# Patient Record
Sex: Female | Born: 1940 | Race: White | Hispanic: No | Smoking: Former smoker
Health system: Southern US, Community
[De-identification: ages and names within clinical notes are randomized; demographics above are authoritative.]

## PROBLEM LIST (undated history)

## (undated) DIAGNOSIS — E785 Hyperlipidemia, unspecified: Secondary | ICD-10-CM

## (undated) DIAGNOSIS — M810 Age-related osteoporosis without current pathological fracture: Secondary | ICD-10-CM

## (undated) DIAGNOSIS — T7840XA Allergy, unspecified, initial encounter: Secondary | ICD-10-CM

## (undated) DIAGNOSIS — I719 Aortic aneurysm of unspecified site, without rupture: Secondary | ICD-10-CM

## (undated) DIAGNOSIS — M419 Scoliosis, unspecified: Secondary | ICD-10-CM

## (undated) DIAGNOSIS — H269 Unspecified cataract: Secondary | ICD-10-CM

## (undated) DIAGNOSIS — I10 Essential (primary) hypertension: Secondary | ICD-10-CM

## (undated) DIAGNOSIS — K219 Gastro-esophageal reflux disease without esophagitis: Secondary | ICD-10-CM

## (undated) DIAGNOSIS — D034 Melanoma in situ of scalp and neck: Secondary | ICD-10-CM

## (undated) DIAGNOSIS — F0390 Unspecified dementia without behavioral disturbance: Secondary | ICD-10-CM

## (undated) DIAGNOSIS — M1990 Unspecified osteoarthritis, unspecified site: Secondary | ICD-10-CM

## (undated) DIAGNOSIS — K259 Gastric ulcer, unspecified as acute or chronic, without hemorrhage or perforation: Secondary | ICD-10-CM

## (undated) DIAGNOSIS — M4050 Lordosis, unspecified, site unspecified: Secondary | ICD-10-CM

## (undated) HISTORY — DX: Unspecified cataract: H26.9

## (undated) HISTORY — DX: Scoliosis, unspecified: M41.9

## (undated) HISTORY — DX: Age-related osteoporosis without current pathological fracture: M81.0

## (undated) HISTORY — DX: Hyperlipidemia, unspecified: E78.5

## (undated) HISTORY — DX: Unspecified osteoarthritis, unspecified site: M19.90

## (undated) HISTORY — DX: Aortic aneurysm of unspecified site, without rupture: I71.9

## (undated) HISTORY — DX: Gastro-esophageal reflux disease without esophagitis: K21.9

## (undated) HISTORY — DX: Melanoma in situ of scalp and neck: D03.4

## (undated) HISTORY — DX: Gastric ulcer, unspecified as acute or chronic, without hemorrhage or perforation: K25.9

## (undated) HISTORY — PX: OTHER SURGICAL HISTORY: SHX169

## (undated) HISTORY — DX: Essential (primary) hypertension: I10

## (undated) HISTORY — DX: Lordosis, unspecified, site unspecified: M40.50

## (undated) HISTORY — DX: Allergy, unspecified, initial encounter: T78.40XA

---

## 1988-11-04 DIAGNOSIS — D034 Melanoma in situ of scalp and neck: Secondary | ICD-10-CM

## 1988-11-04 HISTORY — DX: Melanoma in situ of scalp and neck: D03.4

## 2002-04-28 ENCOUNTER — Encounter: Admission: RE | Admit: 2002-04-28 | Discharge: 2002-07-27 | Payer: Self-pay | Admitting: Pain Medicine

## 2005-03-09 ENCOUNTER — Ambulatory Visit: Payer: Self-pay | Admitting: Cardiology

## 2005-03-17 ENCOUNTER — Ambulatory Visit: Payer: Self-pay

## 2007-01-22 ENCOUNTER — Ambulatory Visit: Payer: Self-pay | Admitting: Vascular Surgery

## 2007-03-21 ENCOUNTER — Ambulatory Visit: Payer: Self-pay | Admitting: Vascular Surgery

## 2007-03-21 ENCOUNTER — Emergency Department (HOSPITAL_COMMUNITY): Admission: EM | Admit: 2007-03-21 | Discharge: 2007-03-21 | Payer: Self-pay | Admitting: Vascular Surgery

## 2007-10-15 ENCOUNTER — Encounter: Admission: RE | Admit: 2007-10-15 | Discharge: 2008-01-01 | Payer: Self-pay | Admitting: Family Medicine

## 2008-01-28 ENCOUNTER — Ambulatory Visit: Payer: Self-pay | Admitting: Vascular Surgery

## 2008-07-22 ENCOUNTER — Encounter: Admission: RE | Admit: 2008-07-22 | Discharge: 2008-07-22 | Payer: Self-pay | Admitting: Vascular Surgery

## 2008-08-04 ENCOUNTER — Ambulatory Visit: Payer: Self-pay | Admitting: Vascular Surgery

## 2008-08-06 ENCOUNTER — Telehealth (INDEPENDENT_AMBULATORY_CARE_PROVIDER_SITE_OTHER): Payer: Self-pay | Admitting: *Deleted

## 2008-08-06 DIAGNOSIS — I719 Aortic aneurysm of unspecified site, without rupture: Secondary | ICD-10-CM

## 2008-08-06 DIAGNOSIS — E7800 Pure hypercholesterolemia, unspecified: Secondary | ICD-10-CM

## 2008-08-06 DIAGNOSIS — I10 Essential (primary) hypertension: Secondary | ICD-10-CM | POA: Insufficient documentation

## 2008-08-06 HISTORY — DX: Aortic aneurysm of unspecified site, without rupture: I71.9

## 2008-08-07 ENCOUNTER — Ambulatory Visit: Payer: Self-pay | Admitting: Cardiology

## 2008-08-10 ENCOUNTER — Ambulatory Visit (HOSPITAL_COMMUNITY): Admission: RE | Admit: 2008-08-10 | Discharge: 2008-08-10 | Payer: Self-pay | Admitting: Vascular Surgery

## 2008-08-10 ENCOUNTER — Ambulatory Visit: Payer: Self-pay | Admitting: Vascular Surgery

## 2008-08-11 ENCOUNTER — Ambulatory Visit: Payer: Self-pay

## 2008-08-11 ENCOUNTER — Encounter (INDEPENDENT_AMBULATORY_CARE_PROVIDER_SITE_OTHER): Payer: Self-pay

## 2008-08-12 ENCOUNTER — Telehealth (INDEPENDENT_AMBULATORY_CARE_PROVIDER_SITE_OTHER): Payer: Self-pay | Admitting: *Deleted

## 2008-08-13 ENCOUNTER — Ambulatory Visit: Payer: Self-pay

## 2008-08-13 ENCOUNTER — Encounter: Payer: Self-pay | Admitting: Cardiology

## 2008-09-01 ENCOUNTER — Ambulatory Visit: Payer: Self-pay | Admitting: Vascular Surgery

## 2008-09-04 ENCOUNTER — Encounter: Payer: Self-pay | Admitting: Cardiology

## 2008-09-24 ENCOUNTER — Ambulatory Visit: Payer: Self-pay | Admitting: Vascular Surgery

## 2008-09-24 ENCOUNTER — Encounter: Payer: Self-pay | Admitting: Vascular Surgery

## 2008-09-24 ENCOUNTER — Inpatient Hospital Stay (HOSPITAL_COMMUNITY): Admission: RE | Admit: 2008-09-24 | Discharge: 2008-09-29 | Payer: Self-pay | Admitting: Vascular Surgery

## 2008-10-06 ENCOUNTER — Ambulatory Visit: Payer: Self-pay | Admitting: Vascular Surgery

## 2008-10-20 ENCOUNTER — Ambulatory Visit: Payer: Self-pay | Admitting: Vascular Surgery

## 2008-10-20 ENCOUNTER — Encounter: Payer: Self-pay | Admitting: Cardiology

## 2009-02-15 ENCOUNTER — Encounter: Admission: RE | Admit: 2009-02-15 | Discharge: 2009-03-04 | Payer: Self-pay | Admitting: Family Medicine

## 2009-03-08 ENCOUNTER — Encounter: Admission: RE | Admit: 2009-03-08 | Discharge: 2009-06-06 | Payer: Self-pay | Admitting: Family Medicine

## 2009-09-10 ENCOUNTER — Ambulatory Visit: Payer: Self-pay | Admitting: Cardiology

## 2009-10-07 ENCOUNTER — Ambulatory Visit: Payer: Self-pay | Admitting: Vascular Surgery

## 2009-10-13 ENCOUNTER — Encounter: Payer: Self-pay | Admitting: Cardiology

## 2010-03-27 ENCOUNTER — Encounter: Payer: Self-pay | Admitting: Vascular Surgery

## 2010-04-05 NOTE — Assessment & Plan Note (Signed)
Summary: ROV/PER DEBBIE/JSS   CC:  referal from Dr. Christell Constant becuase of aneurysm .  History of Present Illness: Pleasant female I saw in June of 2010 for preop eval prior to AAA repair. The patient had been followed for an abdominal aortic aneurysm. She had a CTA  that showed increase in size to 4.5 by 6.3 cm. 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 MRI was recommended for further evaluation. We were asked to evaluate preoperatively prior to aortic aneurysm repair. A myoview was performed in June of 2010 and showed an ejection fraction of 69% and normal perfusion. She subsequently underwent abdominal aortic aneurysm repair in July of 2010. Since then the patient denies any dyspnea on exertion, orthopnea, PND, pedal edema, palpitations, syncope or chest pain.   Current Medications (verified): 1)  Exforge 10-320 Mg Tabs (Amlodipine Besylate-Valsartan) .... 1/2 Tab As Needed 2)  Vitamin D3 1000 Unit Caps (Cholecalciferol) .Marland Kitchen.. 1 Tab By Mouth Once Daily 3)  Tylenol Sinus Night Time 6.25-30-500 Mg Tabs (Doxylamine-Pse-Apap) .... As Needed 4)  Potassium .Marland Kitchen.. 1 Tab By Mouth Once Daily  Allergies: 1)  ! Iodine 2)  ! Aspirin 3)  ! Nsaids  Past History:  Past Medical History:  HYPERCHOLESTEROLEMIA (ICD-272.0) Borderline HYPERTENSION (ICD-401.9) AORTIC ANEURYSM (ICD-441.9) H/O PUD H/O back procedure Osteoporosis  Social History: Reviewed history from 08/07/2008 and no changes required.  She is married.  She has two children.  She quit   smoking approximately 7 years ago.  Alcohol Use - no  Review of Systems       no fevers or chills, productive cough, hemoptysis, dysphasia, odynophagia, melena, hematochezia, dysuria, hematuria, rash, seizure activity, orthopnea, PND, pedal edema, claudication. Remaining systems are negative.   Vital Signs:  Patient profile:   70 year old female Height:      64 inches Weight:      140 pounds BMI:     24.12 Pulse rate:   65  / minute Resp:     12 per minute BP sitting:   163 / 91  (left arm)  Vitals Entered By: Kem Parkinson (September 10, 2009 1:56 PM)  Physical Exam  General:  Well-developed well-nourished in no acute distress.  Skin is warm and dry.  HEENT is normal.  Neck is supple. No thyromegaly.  Chest is clear to auscultation with normal expansion.  Cardiovascular exam is regular rate and rhythm.  Abdominal exam nontender or distended. No masses palpated. Extremities show no edema. neuro grossly intact    EKG  Procedure date:  09/10/2009  Findings:      Normal sinus rhythm at a rate of 65. Left axis. No ST changes.  Impression & Recommendations:  Problem # 1:  AORTIC ANEURYSM (ICD-441.9) Status post repair. Followup Dr. Edilia Bo. Note previous abdominal scan suggested complex cyst in the kidney and followup MRI was recommended. This will be followed by Dr. Edilia Bo and I will forward this for review..  Problem # 2:  HYPERTENSION (ICD-401.9) Blood pressure elevated today. However she states it is normal at home. He will follow this and medications can be adjusted as indicated by Dr. Christell Constant. The following medications were removed from the medication list:    Aspirin 81 Mg Tabs (Aspirin) .Marland Kitchen... As needed Her updated medication list for this problem includes:    Exforge 10-320 Mg Tabs (Amlodipine besylate-valsartan) .Marland Kitchen... 1/2 tab as needed  The following medications were removed from the medication list:    Aspirin 81  Mg Tabs (Aspirin) .Marland Kitchen... As needed Her updated medication list for this problem includes:    Exforge 10-320 Mg Tabs (Amlodipine besylate-valsartan) .Marland Kitchen... 1/2 tab as needed  Problem # 3:  HYPERCHOLESTEROLEMIA (ICD-272.0) Magement per primary care.  Patient Instructions: 1)  Your physician recommends that you schedule a follow-up appointment with dr Cari Caraway

## 2010-05-08 IMAGING — CR DG CHEST 1V PORT
1 series · 1 of 1 positions shown · non-contrast
Comparison: Chest 09/24/2008.

CLINICAL DATA: Abdominal aortic aneurysm.

PORTABLE CHEST - 1 VIEW

[view not recorded]
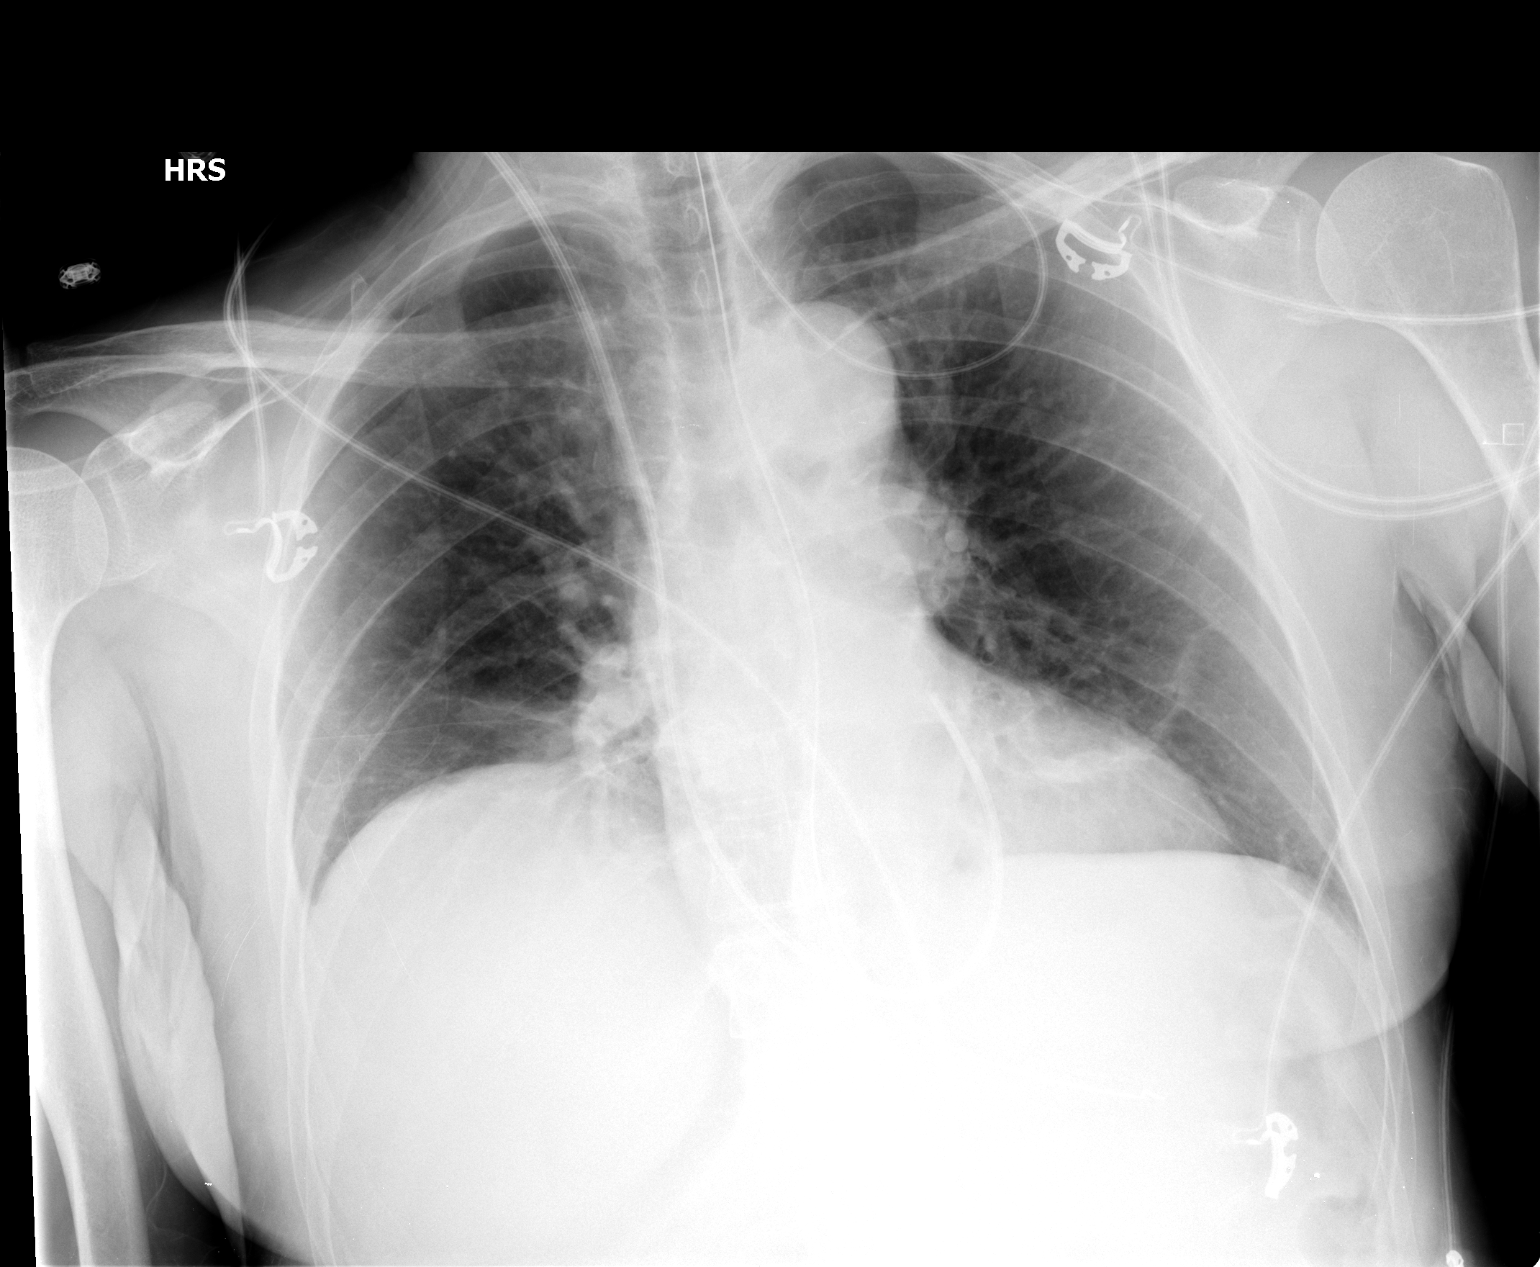

[1 of 1 positions shown; findings below may reference images not displayed]

FINDINGS: Support apparatus is unchanged.  The patient has a tiny
right pleural effusion and mild right basilar atelectasis.  Linear
atelectasis in the left lung base also noted.  Lungs otherwise
clear.  Heart size upper normal.
IMPRESSION: Tiny right pleural effusion and mild bibasilar atelectasis.

## 2010-06-12 LAB — POCT I-STAT 7, (LYTES, BLD GAS, ICA,H+H)
Calcium, Ion: 1.26 mmol/L (ref 1.12–1.32)
HCT: 25 % — ABNORMAL LOW (ref 36.0–46.0)
Hemoglobin: 8.5 g/dL — ABNORMAL LOW (ref 12.0–15.0)
O2 Saturation: 100 %
Sodium: 139 mEq/L (ref 135–145)

## 2010-06-12 LAB — URINALYSIS, ROUTINE W REFLEX MICROSCOPIC
Bilirubin Urine: NEGATIVE
Hgb urine dipstick: NEGATIVE
Ketones, ur: 15 mg/dL — AB
Nitrite: NEGATIVE
Nitrite: NEGATIVE
Specific Gravity, Urine: 1.007 (ref 1.005–1.030)
Urobilinogen, UA: 0.2 mg/dL (ref 0.0–1.0)
Urobilinogen, UA: 1 mg/dL (ref 0.0–1.0)
pH: 6 (ref 5.0–8.0)

## 2010-06-12 LAB — COMPREHENSIVE METABOLIC PANEL
ALT: 13 U/L (ref 0–35)
ALT: 17 U/L (ref 0–35)
AST: 19 U/L (ref 0–37)
Alkaline Phosphatase: 72 U/L (ref 39–117)
CO2: 24 mEq/L (ref 19–32)
Calcium: 10.1 mg/dL (ref 8.4–10.5)
Calcium: 8 mg/dL — ABNORMAL LOW (ref 8.4–10.5)
Chloride: 109 mEq/L (ref 96–112)
Creatinine, Ser: 0.61 mg/dL (ref 0.4–1.2)
GFR calc Af Amer: >60 mL/min (ref >60)
GFR calc non Af Amer: >60 mL/min (ref >60)
Glucose, Bld: 132 mg/dL — ABNORMAL HIGH (ref 70–99)
Glucose, Bld: 81 mg/dL (ref 70–99)
Potassium: 4.3 mEq/L (ref 3.5–5.1)
Sodium: 136 mEq/L (ref 135–145)
Sodium: 138 mEq/L (ref 135–145)
Total Protein: 4.8 g/dL — ABNORMAL LOW (ref 6.0–8.3)

## 2010-06-12 LAB — GLUCOSE, CAPILLARY
Glucose-Capillary: 105 mg/dL — ABNORMAL HIGH (ref 70–99)
Glucose-Capillary: 134 mg/dL — ABNORMAL HIGH (ref 70–99)
Glucose-Capillary: 139 mg/dL — ABNORMAL HIGH (ref 70–99)

## 2010-06-12 LAB — TYPE AND SCREEN
ABO/RH(D): A NEG
Antibody Screen: NEGATIVE

## 2010-06-12 LAB — CBC
Hemoglobin: 12 g/dL (ref 12.0–15.0)
Hemoglobin: 13.5 g/dL (ref 12.0–15.0)
MCHC: 34.2 g/dL (ref 30.0–36.0)
MCHC: 34.7 g/dL (ref 30.0–36.0)
MCV: 85.1 fL (ref 78.0–100.0)
Platelets: 131 10*3/uL — ABNORMAL LOW (ref 150–400)
Platelets: 171 10*3/uL (ref 150–400)
RBC: 3.97 MIL/uL (ref 3.87–5.11)
RBC: 4.63 MIL/uL (ref 3.87–5.11)
RDW: 14.3 % (ref 11.5–15.5)
RDW: 14.4 % (ref 11.5–15.5)
WBC: 10.7 10*3/uL — ABNORMAL HIGH (ref 4.0–10.5)
WBC: 6 10*3/uL (ref 4.0–10.5)

## 2010-06-12 LAB — ABO/RH: ABO/RH(D): A NEG

## 2010-06-12 LAB — BLOOD GAS, ARTERIAL
Acid-base deficit: 0.3 mmol/L (ref 0.0–2.0)
Bicarbonate: 24.7 mEq/L — ABNORMAL HIGH (ref 20.0–24.0)
Bicarbonate: 25.1 mEq/L — ABNORMAL HIGH (ref 20.0–24.0)
FIO2: 0.21 %
O2 Saturation: 96.4 %
Patient temperature: 95.8
Patient temperature: 98.6
TCO2: 25.8 mmol/L (ref 0–100)
TCO2: 26.6 mmol/L (ref 0–100)
pH, Arterial: 7.337 — ABNORMAL LOW (ref 7.350–7.400)
pO2, Arterial: 78.4 mmHg — ABNORMAL LOW (ref 80.0–100.0)

## 2010-06-12 LAB — BASIC METABOLIC PANEL
BUN: 12 mg/dL (ref 6–23)
BUN: 8 mg/dL (ref 6–23)
CO2: 26 mEq/L (ref 19–32)
Calcium: 8.4 mg/dL (ref 8.4–10.5)
Calcium: 9.4 mg/dL (ref 8.4–10.5)
Creatinine, Ser: 0.71 mg/dL (ref 0.4–1.2)
Creatinine, Ser: 0.73 mg/dL (ref 0.4–1.2)
GFR calc Af Amer: >60 mL/min (ref >60)
GFR calc non Af Amer: >60 mL/min (ref >60)
Glucose, Bld: 171 mg/dL — ABNORMAL HIGH (ref 70–99)

## 2010-06-12 LAB — PROTIME-INR
INR: 1.2 (ref 0.00–1.49)
Prothrombin Time: 14 seconds (ref 11.6–15.2)
Prothrombin Time: 15.9 seconds — ABNORMAL HIGH (ref 11.6–15.2)

## 2010-06-12 LAB — POCT I-STAT 3, ART BLOOD GAS (G3+)
O2 Saturation: 96 %
Patient temperature: 99.9
pO2, Arterial: 90 mmHg (ref 80.0–100.0)

## 2010-06-12 LAB — MAGNESIUM: Magnesium: 1.3 mg/dL — ABNORMAL LOW (ref 1.5–2.5)

## 2010-06-12 LAB — URINE MICROSCOPIC-ADD ON

## 2010-06-12 LAB — URINE CULTURE

## 2010-06-13 LAB — POCT I-STAT, CHEM 8
BUN: 8 mg/dL (ref 6–23)
Creatinine, Ser: 0.8 mg/dL (ref 0.4–1.2)
Hemoglobin: 14.3 g/dL (ref 12.0–15.0)
Potassium: 4.1 mEq/L (ref 3.5–5.1)
Sodium: 139 mEq/L (ref 135–145)
TCO2: 26 mmol/L (ref 0–100)

## 2010-07-19 NOTE — Procedures (Signed)
DUPLEX ULTRASOUND OF ABDOMINAL AORTA   INDICATION:  Followup of known AAA.   HISTORY:  Diabetes:  No.  Cardiac:  No.  Hypertension:  No.  Smoking:  No.  Connective Tissue Disorder:  Family History:  No.  Previous Surgery:  No.   DUPLEX EXAM:         AP (cm)                   TRANSVERSE (cm)  Proximal             3.72 cm                   3.74 cm  Mid                  3.90 cm                   3.87 cm  Distal               2.11 cm                   2.19 cm  Right Iliac          0.81 cm                   0.83 cm  Left Iliac           0.71 cm                   0.71 cm   PREVIOUS:  Date: 01/17/06  AP:  3.31  TRANSVERSE:  3.64   IMPRESSION:  1. Mild increase in AAA size since last study with maximum diameter of      3.9 cm (AP) x 3.87 cm.  2. Mild intramural thrombus without stenosis.  3. Bilateral CIAs within normal range.   ___________________________________________  Di Kindle. Edilia Bo, M.D.   PB/MEDQ  D:  01/22/2007  T:  01/23/2007  Job:  [id number]

## 2010-07-19 NOTE — Assessment & Plan Note (Signed)
OFFICE VISIT   SADY, MONACO  DOB:  April 22, 1940                                       10/20/2008  EAVWU#:[id number]   I saw the patient in the office today for followup after recent repair  of her juxtarenal aneurysm.  This is a 70 year old woman who I have been  following with an aneurysm that enlarged to 6.3 cm.  Elective repair was  recommended because of her risk of rupture.  She underwent repair of her  juxtarenal aneurysm with a tube graft on 09/24/2008.  She did well  postoperatively and was discharged on 09/29/2008.  She returns for her  first outpatient visit.  Overall she has been doing well and her  appetite has gradually returned to normal.  She has had no claudication,  chest pain or shortness of breath.   PHYSICAL EXAMINATION:  Blood pressure 136/87, heart rate is 73.  Lungs  are clear bilaterally to auscultation.  On cardiac exam she has a  regular rate and rhythm.  Abdomen is soft and nontender.  She has normal  pitched bowel sounds.  Her incision has healed nicely.  She has palpable  femoral and pedal pulses.   Overall I am pleased with her progress.  I plan on seeing her back in 6  months.  She knows to call sooner if she has problems.   Di Kindle. Edilia Bo, M.D.  Electronically Signed   CSD/MEDQ  D:  10/20/2008  T:  10/21/2008  Job:  [id number]   cc:   Ernestina Penna, M.D.  Madolyn Frieze Jens Som, MD, [facility]

## 2010-07-19 NOTE — Op Note (Signed)
NAME:  DAVINITY, FANARA                   ACCOUNT NO.:  [id number]   MEDICAL RECORD NO.:  [id number]          PATIENT TYPE:  INP   LOCATION:  2001                         FACILITY:  [facility]   PHYSICIAN:  Di Kindle. 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   PROCEDURE:  Repair of juxtarenal abdominal aortic aneurysm with 18 mm  tube graft.   SURGEON:  Di Kindle. Edilia Bo, M.D.   ASSISTANT:  Dr. Juleen China, IV  Iva Boop, PA   ANESTHESIA:  General.   INDICATION:  This is a 70 year old woman who I have been following with  a juxtarenal aneurysm.  This enlarged to 6.3 cm and elective repair was  recommended.  She underwent preoperative cardiac evaluation by Dr.  Jens Som and was brought in for elective repair.   PROCEDURE IN DETAIL:  The patient was taken to the operating room and  received a general anesthetic.  Swan-Ganz catheter and arterial line had  been placed by anesthesia.  The abdomen and groins were prepped and  draped in the usual sterile fashion.  The abdomen was entered through a  midline incision and upon careful exploration no other intra-abdominal  pathology was noted except for the juxtarenal aneurysm.  The transverse  colon was reflected superiorly and the small bowel reflected to the  right.  The retroperitoneal tissue was divided up to level of the renal  vein.  This aneurysm extended right up to the renal veins and for this  reason a suprarenal clamp was required.  This necessitated dividing the  renal vein.  The renal vein was clamped between Henley clamps and  divided and then each end oversewn with a 5-0 Prolene suture.  This  allowed exposure of the suprarenal aorta.  Both renal arteries were  controlled with  blue vessel loops and then I dissected out enough of the  suprarenal aorta for placement of a clamp.  The dissection was then  continued distally down to the bifurcation and both common iliac  arteries were exposed such that clamps could be placed.  The patient was  then heparinized.  Renal dopamine was started.  After the heparin had  circulated the suprarenal aorta was clamped and both renal arteries were  controlled.  The common iliac arteries were clamped.  The aneurysm was  then opened and lumbar is oversewn with 2-0 silk sutures.  The aneurysm  was T'd off proximally and distally.  An 18 mm graft was selected and  the graft was sewn end-to-end to the infrarenal aorta right at the level  of the renal arteries.  Anteriorly a felt cuff was used.  After  completing the anastomosis it was tested and was hemostatic.  There was  one repair suture that was placed with a pledget.  At this point Doppler  was used to assess the renals and there was excellent  renal flow and  good urine output at this point.  The graft was then cut to the  appropriate length for anastomosis of the distal aorta.  This was done  with a running 3-0 Prolene suture.  Prior to completing the closure the  arteries were back bled and flushed appropriately and the anastomosis  completed.  Flow was reestablished first in the pelvis.  The patient  tolerated this from a hemodynamic standpoint.  Hemostasis was obtained  in the wound and the heparin was partially reversed with protamine.  The  aneurysm sac was then closed over the graft with running 2-0 Vicryl.  The retroperitoneal tissue was closed with running 2-0 Vicryl.  The  abdominal contents were returned to their normal position and then the  fascial layer was closed with two running #1  PDS sutures.  The skin was closed with staples.  At the completion the  feet were warm and there were palpable posterior tibial pulses, no  evidence of atheroembolic disease.  The  patient tolerated the procedure  well and was transferred to the recovery room in stable condition.  All  needle and sponge counts were correct.      Di Kindle. Edilia Bo, M.D.  Electronically Signed     CSD/MEDQ  D:  09/24/2008  T:  09/25/2008  Job:  [id number]   cc:   Madolyn Frieze. Jens Som, MD, [facility]  Ernestina Penna, M.D.

## 2010-07-19 NOTE — Op Note (Signed)
NAME:  Tammy [name], Tammy [name]                   ACCOUNT NO.:  [id number]   MEDICAL RECORD NO.:  [id number]          PATIENT TYPE:  AMB   LOCATION:  SDS                          FACILITY:  [facility]   PHYSICIAN:  Di Kindle. Edilia Bo, M.D.DATE OF BIRTH:  1941/01/24   DATE OF PROCEDURE:  08/10/2008  DATE OF DISCHARGE:                               OPERATIVE REPORT   PREOPERATIVE DIAGNOSIS:  Abdominal aortic aneurysm.   POSTOPERATIVE DIAGNOSIS:  Abdominal aortic aneurysm.   PROCEDURES:  1. Ultrasound-guided access to the right common femoral artery.  2. Aortogram with bilateral iliac arteriogram and bilateral lower      extremity runoff.  3. Closure of right common femoral artery using Perclose device.   SURGEON:  Di Kindle. Edilia Bo, MD   ANESTHESIA:  Local with sedation.   TECHNIQUE:  The patient was taken to the PV lab and sedated with 1 mg of  Versed and 50 mcg of fentanyl.  The right groin was prepped and draped  in the usual sterile fashion.  After the skin was infiltrated with 1%  lidocaine under ultrasound guidance, the right common femoral artery was  cannulated and a guidewire introduced into the infrarenal aorta.  A 5-  French sheath was introduced over the wire.  A pigtail catheter was  positioned at the L1 vertebral body and flush aortogram obtained.  Lateral projection was then obtained and also obliques obtained around  the level of the renals as the aneurysm was juxtarenal and it was  difficult to determine the level of the renal arteries on the straight  AP.  Catheter was then repositioned above the aortic bifurcation and  oblique iliac projections were obtained and then bilateral lower  extremity runoff films were obtained.   FINDINGS:  There are single renal arteries bilaterally with no  significant renal artery stenosis identified.  The aneurysm extends up  to the level of the renal arteries.  The aorta above the renals is  slightly ectatic but is not aneurysmal.  The  size of the aneurysm cannot  be determined by the study.  The aneurysm appears to end at the  bifurcation.  The common iliac arteries are slightly ectatic bilaterally  but again not frankly aneurysmal.  Hypogastric arteries are patent  bilaterally as are the external iliac arteries.   On the right side, the common femoral, deep femoral, and superficial  femoral arteries are patent.  The popliteal artery is patent.  There is  a high takeoff of the anterior tibial artery on the right.  Anterior  tibial, peroneal, and posterior tibial arteries are patent.   On the left side, common femoral, superficial femoral, and deep femoral  arteries are patent.  The popliteal and tibial vessels are patent on the  left with three-vessel runoff on the left via the anterior tibial,  posterior tibial, and peroneal arteries.   CONCLUSIONS:  Juxtarenal abdominal aortic aneurysm with no significant  occlusive disease noted.      Di Kindle. Edilia Bo, M.D.  Electronically Signed     CSD/MEDQ  D:  08/10/2008  T:  08/11/2008  Job:  [id number]   cc:   Ernestina Penna, M.D.  Madolyn Frieze Jens Som, MD, [facility]

## 2010-07-19 NOTE — Assessment & Plan Note (Signed)
OFFICE VISIT   Tammy [name], SCAFF  DOB:  Dec 17, 1940                                       01/28/2008  [id number]#:[id number]   I saw the patient in the office today for continued followup of her  abdominal aortic aneurysm.  I last saw her in November of 2007.  At that  time ultrasound had suggested it was 3.9 cm in maximum diameter although  based on a previous MRA back in May of 2006 it was 4.3 cm.  Regardless,  she comes in for a yearly followup visit.  She has had no abdominal or  back pain since I saw her last.  Her only complaint has been her blood  pressure occasionally has been elevated and she thinks this is likely  related to stress as her daughter and a grandchild have been living with  her.   REVIEW OF SYSTEMS:  On review of systems she has had no recent chest  pain, chest pressure, palpitations or arrhythmias.  She has had no  bronchitis, asthma or wheezing.   PHYSICAL EXAMINATION:  General:  On physical examination this is a  pleasant 70 year old woman who appears her stated age.  Vital signs:  Blood pressure is 177/105, heart rate is 71.  Of note, she states she  has been off her blood pressure medications.  Neck:  I do not detect any  carotid bruits.  Lungs:  Are clear bilaterally to auscultation.  Cardiac:  She has a regular rate and rhythm.  Abdomen:  Soft and  nontender.  Her aneurysm is palpable and nontender.  Both feet are warm  and well-perfused without ischemic ulcers.  She has no significant lower  extremity swelling.   Ultrasound in our office today shows a maximum diameter of the aneurysm  is 4.5 cm.  Again, I think this is likely enlarged slightly but given  the MRA which showed the aneurysm was 4.3 cm back in 2006 I do not think  there has been any significant enlargement.   Given the slight enlargement, however, I have recommend that we do a  followup study in 6 months and I think we will do a CT scan at that  time.  She understands we  generally would not consider elective repair  of the aneurysm unless it reached 5.5 cm in maximum diameter.   She is going to follow up with Dr. Christell Constant concerning her blood pressure.  Fortunately she has quit smoking.  She will see me back in 1 year unless  she calls sooner.  We will arrange her CT scan of the abdomen and  pelvis.   Di Kindle. Edilia Bo, M.D.  Electronically Signed   CSD/MEDQ  D:  01/28/2008  T:  01/29/2008  Job:  [id number]   cc:   Ernestina Penna, M.D.

## 2010-07-19 NOTE — Assessment & Plan Note (Signed)
OFFICE VISIT   SHAKURA, COWING  DOB:  30-Jun-1940                                       10/07/2009  [id number]#:[id number]   I saw Ms. Tammy [name] in the office today for follow-up after repair of a  juxtarenal aneurysm.  This was done in July 2010.  She comes in for  routine 48-month visit.  Since I saw her last, she has had no abdominal  pain or back pain.  She has been resuming her normal activities and has  no specific complaints.   REVIEW OF SYSTEMS:  She has had no chest pain, chest pressure,  palpitations or arrhythmias.   PHYSICAL EXAMINATION:  This is a pleasant 70 year old woman who appears  her stated age.  Blood pressure is 164/91, heart rate is 65, rest, respiratory rate is  18.  LUNGS:  Clear bilaterally to auscultation.  CARDIOVASCULAR:  She has a regular rate and rhythm.  ABDOMEN:  Soft and nontender.  Her incision has healed nicely.  She has palpable femoral and pulses palpable pedal pulses.   Overall I am pleased with her progress.  We did discuss the importance  of receiving prophylactic antibiotics for any invasive procedures given  that she has a prosthetic graft.  I will be happy to see her back at any  time if any new vascular issues arise.  Otherwise I will plan on seeing  her back p.r.n.     Di Kindle. Edilia Bo, M.D.  Electronically Signed   CSD/MEDQ  D:  10/03/2009  T:  10/08/2009  Job:  [id number]

## 2010-07-19 NOTE — Procedures (Signed)
DUPLEX ULTRASOUND OF ABDOMINAL AORTA   INDICATION:  Follow up abdominal aortic aneurysm.   HISTORY:  Diabetes:  No.  Cardiac:  No.  Hypertension:  No.  Smoking:  No.  Connective Tissue Disorder:  Family History:  No.  Previous Surgery:  No.   DUPLEX EXAM:         AP (cm)                   TRANSVERSE (cm)  Proximal             2.9 Cm                    2.9 cm  Mid                  4.4 cm                    4.5 cm  Distal               2.6 cm                    2.6 cm  Right Iliac          1.1 cm                    1.0 cm  Left Iliac           1.1 cm                    1.1 cm   PREVIOUS:  Date: 01/22/2007  AP:  3.9  TRANSVERSE:  3.87   IMPRESSION:  Aneurysm of the mid abdominal aorta with an increase in the  maximum diameter noted when compared to the previous examination on  01/22/2007.   ___________________________________________  Di Kindle. Edilia Bo, M.D.   CH/MEDQ  D:  01/28/2008  T:  01/28/2008  Job:  [id number]

## 2010-07-19 NOTE — Discharge Summary (Signed)
NAME:  Tammy [name], Tammy [name]                   ACCOUNT NO.:  [id number]   MEDICAL RECORD NO.:  [id number]          PATIENT TYPE:  INP   LOCATION:  2001                         FACILITY:  [facility]   PHYSICIAN:  Di Kindle. Edilia Bo, M.D.DATE OF BIRTH:  02/21/1941   DATE OF ADMISSION:  09/24/2008  DATE OF DISCHARGE:  09/29/2008                               DISCHARGE SUMMARY   REASON FOR ADMISSION:  Juxtarenal abdominal aortic aneurysm.   HISTORY:  This is a 70 year old woman who I have been following with a  juxtarenal aneurysm.  This enlarged from 4.5 cm in November 2009, to 6.3  cm in June 2010, given the significant enlargement of the aneurysm,  which was now greater than 5.5 cm, elective repair was recommended.  She  was clearly not a candidate for endovascular repair given that it was a  juxtarenal aneurysm extended up to the level of renal arteries.  She  underwent preoperative cardiac evaluation by Dr. Jens Som and is brought  in for elective surgery.  The remainder of the history and physical is  as dictated without addition or deletion.   HOSPITAL COURSE:  The patient was admitted on September 24, 2008.  She was  taken to the operating room and underwent repair of a juxtarenal  abdominal aortic aneurysm.  This required placement of a suprarenal  clamp.  She did well postoperatively and maintained excellent urine  output with no bump in her renal function.  She remained hemodynamically  stable and was monitored in the intensive care unit overnight.  She was  initially on renal dopamine, which was discontinued on postoperative day  #1.  By postoperative day #2, her NG tube was discontinued and her  activity level was gradually increased.  She was found to have a urinary  tract infection on postoperative day #3.  She was started on Cipro with  the plans to continue this for 1 week.  By postoperative day #5, she was  tolerating a regular diet, ambulating and her bowels were functioning  normally.   Her pain was adequately controlled with minimal pain  medication.  She was discharged to home with instructions to follow up  in 1 week for staple removal.   DISCHARGE DIAGNOSIS:  Juxtarenal abdominal aortic aneurysm.   PROCEDURES:  Repair of juxtarenal abdominal aortic aneurysm on September 24, 2008.   DISCHARGE MEDICATIONS:  1. Oxycodone 5 mg 1-2 every 4 hours p.r.n. pain.  2. Cipro 500 mg p.o. x5 days.  3. Aspirin 81 mg p.o. daily.  4. Omeprazole 20 mg p.o. b.i.d.  5. Exforge 10/320 half a tab p.o. daily.   CONDITION ON DISCHARGE:  Good.   Discharge is to home.  Followup is in 1 week for staple removal.      Di Kindle. Edilia Bo, M.D.  Electronically Signed     CSD/MEDQ  D:  09/29/2008  T:  09/29/2008  Job:  [id number]   cc:   Ernestina Penna, M.D.  Madolyn Frieze Jens Som, MD, [facility]

## 2010-07-19 NOTE — H&P (Signed)
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   HISTORY:  This is a pleasant 70 year old woman who I have been following  with a juxtarenal aneurysm.  In November of 2009 the aneurysm measured  4.5 cm in maximum diameter.  On her most recent followup CAT scan June 1  the aneurysm had enlarged to 6.3 cm in maximum diameter.  Given the  enlargement of the aneurysm and risk for rupture she was admitted for  elective repair.  Of note, given that the aneurysm is juxtarenal she is  not a candidate for an endovascular repair.  She has undergone  preoperative cardiac evaluation by Dr. Jens Som.  She had a Cardiolite  done which showed normal stress nuclear study and no evidence of  ischemia.  Of note, she has had no significant abdominal or back pain.   PAST MEDICAL HISTORY:  Significant for hypertension,  hypercholesterolemia.  She denies any history of diabetes, history of  previous myocardial infarction, history of congestive heart failure or  history of COPD.   FAMILY HISTORY:  She is unaware of any history of premature  cardiovascular disease.  There is no history of aneurysmal disease that  she is aware of.   SOCIAL HISTORY:  She is married.  She has two children.  She quit  smoking 7 years ago.   MEDICATIONS:  1. Are aspirin 81 mg daily.  2. Exforge 10/320 mg half tab daily as needed.  3. Omeprazole 20 mg p.o. b.i.d.   REVIEW OF SYSTEMS:  GENERAL:  She has had no recent weight loss, weight  gain or problem with her appetite.  She is 155 pounds, 5 feet 3 inches  tall.  CARDIAC:  She has had no chest pain, chest pressure, palpitations or  arrhythmias.  She has had no productive cough, bronchitis, asthma or  wheezing.  PULMONARY:  She has had no productive cough, bronchitis, asthma or  wheezing.  GI:  She has a history of hiatal  hernia.  She has had no change in bowel  habits and no history of peptic ulcer disease.  GU:  She has had no dysuria or frequency.  VASCULAR:  She denies claudication, rest pain or nonhealing ulcers.  She  has had no history of DVT or phlebitis.  NEURO:  She has had no dizziness, blackouts, headaches or seizures.  ENT:  She has had a recent cold which she is recovering from.  HEMATOLOGY:  She has had no bleeding problems or clotting disorders.   PHYSICAL EXAMINATION:  General:  This is a pleasant 70 year old woman  who appears her stated age.  Vital signs:  Her blood pressure is 130/84,  heart rate is 102.  Neck:  Supple.  There is no cervical  lymphadenopathy.  I do not detect any carotid bruits.  Lungs:  Clear  bilaterally to auscultation.  Cardiac:  She has a regular rate and  rhythm.  Abdomen: Soft and nontender.  She has normal pitched bowel  sounds.  Her aneurysm is palpable and nontender.  She has palpable  femoral pulses and warm and well-perfused feet without ischemic ulcers.  She has no evidence of atheroembolic disease.   Her CT scan shows a 6.3 cm infrarenal abdominal aneurysm which has  enlarged fairly significantly over the last  6 months.  I think the risk  of rupture for an aneurysm this size is 5-10% per year.  For this reason  I have recommended elective repair.  She is not a candidate for  endovascular repair and therefore we have discussed open repair.  She  has had a preoperative cardiac evaluation by Dr. Jens Som.  We have  discussed the indications for surgery and the potential complications  including but not limited to bleeding, wound healing problems, renal  insufficiency, graft infection.  She understands the risk of mortality  or major morbidity is 4-5%.  All of her questions were answered.  She is  agreeable to proceed.  She would like to recover from her cold first and  her surgery has been scheduled for July 22.   Di Kindle. Edilia Bo, M.D.   Electronically Signed   CSD/MEDQ  D:  09/01/2008  T:  09/02/2008  Job:  [id number]   cc:   Ernestina Penna, M.D.  Madolyn Frieze Jens Som, MD, [facility]

## 2010-07-19 NOTE — Assessment & Plan Note (Signed)
OFFICE VISIT   Tammy, [name]  DOB:  [birth year]                                       08/04/2008  ZOXWR#:[id number]   I saw the patient in the office today for continued followup of her  abdominal aortic aneurysm.  I last saw her in November of 2009 at which  time the aneurysm measured 4.5 cm in maximum diameter by ultrasound.  I  set her up for a 6 month followup CT scan and she comes in with that  report today.  Of note, she has had no significant abdominal or back  pain.   PAST MEDICAL HISTORY:  Significant for hypertension and  hypercholesterolemia.  She denies any history of diabetes, history of  previous myocardial infarction, history of congestive heart failure or  history of COPD.   FAMILY HISTORY:  She is unaware of any aneurysmal disease in her family.  There is no history of premature cardiovascular disease.   SOCIAL HISTORY:  She is married.  She has two children.  She quit  smoking approximately 7 years ago.   REVIEW OF SYSTEMS:  She has had no recent weight loss or weight gain.  She is 155 pounds, 5 feet 2 inches tall.  CARDIAC:  She has had no chest pain, chest pressure, palpitations or  arrhythmias.  She denies any significant dyspnea on exertion or  orthopnea.  GI:  She does have a history of a hiatal hernia.  ORTHO:  She does have a history of arthritis.  Pulmonary, GU, vascular, neuro, psychiatric, ENT and hematologic review  of systems are unremarkable.   ALLERGIES:  Are iodine and aspirin.   MEDICATIONS:  1. Include omeprazole 20 mg p.o. daily.  2. Zetia 10 mg p.o. daily.  3. Zocor 80 mg p.o. daily, although she takes the Zocor intermittently      she says because of joint pain.   PHYSICAL EXAMINATION:  General:  This is a pleasant 70 year old woman  who appears her stated age.  Vital signs:  Her blood pressure is 158/94,  heart rate is 65.  Neck:  Is supple.  There is no cervical  lymphadenopathy.  I do not detect any carotid  bruits.  HEENT:  Unremarkable.  Lungs:  Are clear bilaterally to auscultation.  Cardiac:  She has a regular rate and rhythm.  Abdomen:  Soft and nontender.  Her  aneurysm is palpable.  Femoral pulses and warm, well-perfused feet  bilaterally with no evidence of atheroembolic disease.  Neurologic:  Exam is nonfocal.   I reviewed her CAT scan which does show that the aneurysm has enlarged  to 6.3 cm in maximum diameter.  Of note, this is a juxtarenal aneurysm  and therefore she is not likely a candidate for endovascular repair.   Given the enlargement of her aneurysm I think we will have to proceed  with elective repair.  I will arrange for an arteriogram and also a  preoperative Cardiolite and cardiac evaluation by [facility].  Once her workup is complete I think we can schedule elective open repair  of her aneurysm.  We have discussed the risk of the aneurysm including a  5-10% risk of rupture for an aneurysm   DICTATION ENDS AT THIS POINT   Di Kindle. Edilia Bo, M.D.  Electronically Signed   CSD/MEDQ  D:  08/04/2008  T:  08/05/2008  Job:  [id number]   cc:   Ernestina Penna, M.D.  Madolyn Frieze Jens Som, MD, [facility]

## 2010-07-19 NOTE — Consult Note (Signed)
NAME:  Tammy [name], Tammy [name]                   ACCOUNT NO.:  [id number]   MEDICAL RECORD NO.:  [id number]          PATIENT TYPE:  EMS   LOCATION:  [facility]                         FACILITY:  [facility]   PHYSICIAN:  Di Kindle. Edilia Bo, M.D.DATE OF BIRTH:  09/30/1940   DATE OF CONSULTATION:  03/21/2007  DATE OF DISCHARGE:                                 CONSULTATION   REASON FOR CONSULTATION:  Left thigh pain and history of small aneurysm.   HISTORY:  This is a pleasant 70 year old woman who is well known to me.  I had been following her with a small aneurysm.  I had last seen her in  the office on January 17, 2006 at which time the aneurysm measured 3.6  cm in maximum diameter.  She was scheduled for a yearly follow up visit  in November 2008, the aneurysm was 3.9 cm.  She was scheduled for  another yearly follow up visit.  She developed a sudden onset of pain in  her left thigh at 3 a.m. last night that awoke her from her sleep.  It  was described as a sharp pain which was aggravated by trying to elevate  her leg.  It sounded much like a cramp or spasm.  This lasted  approximately 2-3 minutes and resolved.  She simply had some aching pain  in her leg after that.  At this point, her symptoms have essentially  completely resolved.  She denied any history of abdominal or back pain.   PAST MEDICAL HISTORY:  1. Significant for hypercholesterolemia.  2. She denies any history of diabetes, hypertension, history of      previous myocardial infarction or history of congestive heart      failure.   SOCIAL HISTORY:  She quit tobacco 5-6 years ago.  She is married.   REVIEW OF SYSTEMS:  She has had no claudication, rest pain or nonhealing  ulcers.  She has had no recent chest pain, chest pressure, palpitations  or arrhythmias.  She has had no recent bronchitis, asthma or wheezing.   PHYSICAL EXAMINATION:  VITAL SIGNS:  Blood pressure 120/70, heart rate  is 72.  NECK:  Supple.  There are no carotid  bruits.  LUNGS:  Clear bilaterally to auscultation.  CARDIAC:  She has a regular rate and rhythm.  ABDOMEN:  Soft and nontender.  Her aneurysm is palpable and nontender.  EXTREMITIES:  She has palpable femoral pulses and palpable posterior  tibial pulses bilaterally.  Both feet are warm and well perfused.  She  has no tenderness to palpation in her left thigh where she had pain  earlier today.   IMPRESSION:  I think this patient simply had some muscle spasms or  cramps in the left thigh which has resolved.  I do not think this is in  any way related to her aneurysm.  I do not see any indication for a CT  scan at this point.  She will keep her regularly scheduled follow up  appointment.  If her symptoms return, she will let us know in the  office.  Di Kindle. Edilia Bo, M.D.  Electronically Signed    CSD/MEDQ  D:  03/21/2007  T:  03/21/2007  Job:  [id number]

## 2010-11-16 ENCOUNTER — Encounter: Payer: Self-pay | Admitting: Cardiology

## 2011-03-10 DIAGNOSIS — Z1231 Encounter for screening mammogram for malignant neoplasm of breast: Secondary | ICD-10-CM | POA: Diagnosis not present

## 2011-04-18 DIAGNOSIS — I10 Essential (primary) hypertension: Secondary | ICD-10-CM | POA: Diagnosis not present

## 2011-04-18 DIAGNOSIS — Z124 Encounter for screening for malignant neoplasm of cervix: Secondary | ICD-10-CM | POA: Diagnosis not present

## 2011-05-18 DIAGNOSIS — E785 Hyperlipidemia, unspecified: Secondary | ICD-10-CM | POA: Diagnosis not present

## 2011-05-18 DIAGNOSIS — I10 Essential (primary) hypertension: Secondary | ICD-10-CM | POA: Diagnosis not present

## 2011-05-18 DIAGNOSIS — I2510 Atherosclerotic heart disease of native coronary artery without angina pectoris: Secondary | ICD-10-CM | POA: Diagnosis not present

## 2011-05-18 DIAGNOSIS — E559 Vitamin D deficiency, unspecified: Secondary | ICD-10-CM | POA: Diagnosis not present

## 2011-06-27 DIAGNOSIS — I10 Essential (primary) hypertension: Secondary | ICD-10-CM | POA: Diagnosis not present

## 2011-06-27 DIAGNOSIS — E559 Vitamin D deficiency, unspecified: Secondary | ICD-10-CM | POA: Diagnosis not present

## 2011-06-27 DIAGNOSIS — E039 Hypothyroidism, unspecified: Secondary | ICD-10-CM | POA: Diagnosis not present

## 2011-06-27 DIAGNOSIS — E785 Hyperlipidemia, unspecified: Secondary | ICD-10-CM | POA: Diagnosis not present

## 2011-07-03 DIAGNOSIS — H02839 Dermatochalasis of unspecified eye, unspecified eyelid: Secondary | ICD-10-CM | POA: Diagnosis not present

## 2011-07-03 DIAGNOSIS — H5210 Myopia, unspecified eye: Secondary | ICD-10-CM | POA: Diagnosis not present

## 2011-07-03 DIAGNOSIS — H353190 Nonexudative age-related macular degeneration, unspecified eye, stage unspecified: Secondary | ICD-10-CM | POA: Diagnosis not present

## 2011-07-03 DIAGNOSIS — H2589 Other age-related cataract: Secondary | ICD-10-CM | POA: Diagnosis not present

## 2011-08-03 DIAGNOSIS — M6530 Trigger finger, unspecified finger: Secondary | ICD-10-CM | POA: Diagnosis not present

## 2011-08-03 DIAGNOSIS — IMO0002 Reserved for concepts with insufficient information to code with codable children: Secondary | ICD-10-CM | POA: Diagnosis not present

## 2011-08-03 DIAGNOSIS — M1710 Unilateral primary osteoarthritis, unspecified knee: Secondary | ICD-10-CM | POA: Diagnosis not present

## 2011-10-09 DIAGNOSIS — R5381 Other malaise: Secondary | ICD-10-CM | POA: Diagnosis not present

## 2011-10-09 DIAGNOSIS — R7989 Other specified abnormal findings of blood chemistry: Secondary | ICD-10-CM | POA: Diagnosis not present

## 2011-10-09 DIAGNOSIS — R5383 Other fatigue: Secondary | ICD-10-CM | POA: Diagnosis not present

## 2011-10-09 DIAGNOSIS — I10 Essential (primary) hypertension: Secondary | ICD-10-CM | POA: Diagnosis not present

## 2011-10-09 DIAGNOSIS — E785 Hyperlipidemia, unspecified: Secondary | ICD-10-CM | POA: Diagnosis not present

## 2011-10-09 DIAGNOSIS — E559 Vitamin D deficiency, unspecified: Secondary | ICD-10-CM | POA: Diagnosis not present

## 2011-10-10 DIAGNOSIS — I10 Essential (primary) hypertension: Secondary | ICD-10-CM | POA: Diagnosis not present

## 2011-10-10 DIAGNOSIS — I2510 Atherosclerotic heart disease of native coronary artery without angina pectoris: Secondary | ICD-10-CM | POA: Diagnosis not present

## 2011-10-18 DIAGNOSIS — L820 Inflamed seborrheic keratosis: Secondary | ICD-10-CM | POA: Diagnosis not present

## 2011-10-18 DIAGNOSIS — L570 Actinic keratosis: Secondary | ICD-10-CM | POA: Diagnosis not present

## 2011-12-04 DIAGNOSIS — Z1212 Encounter for screening for malignant neoplasm of rectum: Secondary | ICD-10-CM | POA: Diagnosis not present

## 2012-01-10 DIAGNOSIS — E785 Hyperlipidemia, unspecified: Secondary | ICD-10-CM | POA: Diagnosis not present

## 2012-01-10 DIAGNOSIS — E559 Vitamin D deficiency, unspecified: Secondary | ICD-10-CM | POA: Diagnosis not present

## 2012-01-10 DIAGNOSIS — I2510 Atherosclerotic heart disease of native coronary artery without angina pectoris: Secondary | ICD-10-CM | POA: Diagnosis not present

## 2012-01-10 DIAGNOSIS — I10 Essential (primary) hypertension: Secondary | ICD-10-CM | POA: Diagnosis not present

## 2012-01-10 DIAGNOSIS — N390 Urinary tract infection, site not specified: Secondary | ICD-10-CM | POA: Diagnosis not present

## 2012-03-11 DIAGNOSIS — Z1231 Encounter for screening mammogram for malignant neoplasm of breast: Secondary | ICD-10-CM | POA: Diagnosis not present

## 2012-04-22 DIAGNOSIS — E785 Hyperlipidemia, unspecified: Secondary | ICD-10-CM | POA: Diagnosis not present

## 2012-04-22 DIAGNOSIS — R5381 Other malaise: Secondary | ICD-10-CM | POA: Diagnosis not present

## 2012-04-22 DIAGNOSIS — I10 Essential (primary) hypertension: Secondary | ICD-10-CM | POA: Diagnosis not present

## 2012-04-22 DIAGNOSIS — E559 Vitamin D deficiency, unspecified: Secondary | ICD-10-CM | POA: Diagnosis not present

## 2012-04-22 DIAGNOSIS — R5383 Other fatigue: Secondary | ICD-10-CM | POA: Diagnosis not present

## 2012-04-22 DIAGNOSIS — J069 Acute upper respiratory infection, unspecified: Secondary | ICD-10-CM | POA: Diagnosis not present

## 2012-04-22 DIAGNOSIS — R42 Dizziness and giddiness: Secondary | ICD-10-CM | POA: Diagnosis not present

## 2012-07-11 DIAGNOSIS — H524 Presbyopia: Secondary | ICD-10-CM | POA: Diagnosis not present

## 2012-07-11 DIAGNOSIS — H2510 Age-related nuclear cataract, unspecified eye: Secondary | ICD-10-CM | POA: Diagnosis not present

## 2012-07-11 DIAGNOSIS — H5210 Myopia, unspecified eye: Secondary | ICD-10-CM | POA: Diagnosis not present

## 2012-07-11 DIAGNOSIS — H353190 Nonexudative age-related macular degeneration, unspecified eye, stage unspecified: Secondary | ICD-10-CM | POA: Diagnosis not present

## 2012-07-30 ENCOUNTER — Encounter: Payer: Self-pay | Admitting: *Deleted

## 2012-08-21 ENCOUNTER — Ambulatory Visit: Payer: Self-pay | Admitting: Family Medicine

## 2012-09-02 ENCOUNTER — Other Ambulatory Visit: Payer: Self-pay | Admitting: *Deleted

## 2012-09-02 ENCOUNTER — Other Ambulatory Visit (INDEPENDENT_AMBULATORY_CARE_PROVIDER_SITE_OTHER): Payer: Medicare Other

## 2012-09-02 DIAGNOSIS — R5383 Other fatigue: Secondary | ICD-10-CM

## 2012-09-02 DIAGNOSIS — E785 Hyperlipidemia, unspecified: Secondary | ICD-10-CM | POA: Diagnosis not present

## 2012-09-02 DIAGNOSIS — E559 Vitamin D deficiency, unspecified: Secondary | ICD-10-CM | POA: Diagnosis not present

## 2012-09-02 DIAGNOSIS — I10 Essential (primary) hypertension: Secondary | ICD-10-CM

## 2012-09-02 DIAGNOSIS — R5381 Other malaise: Secondary | ICD-10-CM | POA: Diagnosis not present

## 2012-09-02 LAB — POCT CBC
Lymph, poc: 2 (ref 0.6–3.4)
MCH, POC: 28.9 pg (ref 27–31.2)
MCV: 83.7 fL (ref 80–97)
MPV: 7.2 fL (ref 0–99.8)
POC LYMPH PERCENT: 29.4 %L (ref 10–50)
Platelet Count, POC: 223 10*3/uL (ref 142–424)
RBC: 5 M/uL (ref 4.04–5.48)
RDW, POC: 14.5 %
WBC: 6.9 10*3/uL (ref 4.6–10.2)

## 2012-09-02 LAB — BASIC METABOLIC PANEL WITH GFR
BUN: 12 mg/dL (ref 6–23)
Calcium: 10.3 mg/dL (ref 8.4–10.5)
Chloride: 105 mEq/L (ref 96–112)
Creat: 0.77 mg/dL (ref 0.50–1.10)
GFR, Est African American: 89 mL/min
GFR, Est Non African American: 77 mL/min

## 2012-09-02 LAB — HEPATIC FUNCTION PANEL
ALT: 15 U/L (ref 0–35)
Albumin: 4.5 g/dL (ref 3.5–5.2)
Indirect Bilirubin: 0.5 mg/dL (ref 0.0–0.9)
Total Protein: 6.5 g/dL (ref 6.0–8.3)

## 2012-09-02 LAB — THYROID PANEL WITH TSH
Free Thyroxine Index: 2.5 (ref 1.0–3.9)
T3 Uptake: 30.5 % (ref 22.5–37.0)
T4, Total: 8.3 ug/dL (ref 5.0–12.5)
TSH: 2.172 u[IU]/mL (ref 0.350–4.500)

## 2012-09-03 ENCOUNTER — Encounter: Payer: Self-pay | Admitting: Family Medicine

## 2012-09-03 ENCOUNTER — Ambulatory Visit (INDEPENDENT_AMBULATORY_CARE_PROVIDER_SITE_OTHER): Payer: Medicare Other | Admitting: Family Medicine

## 2012-09-03 VITALS — BP 146/88 | HR 78 | Temp 98.3°F | Ht 63.0 in | Wt 146.4 lb

## 2012-09-03 DIAGNOSIS — E785 Hyperlipidemia, unspecified: Secondary | ICD-10-CM

## 2012-09-03 DIAGNOSIS — R5381 Other malaise: Secondary | ICD-10-CM

## 2012-09-03 DIAGNOSIS — E559 Vitamin D deficiency, unspecified: Secondary | ICD-10-CM | POA: Diagnosis not present

## 2012-09-03 DIAGNOSIS — I10 Essential (primary) hypertension: Secondary | ICD-10-CM | POA: Diagnosis not present

## 2012-09-03 DIAGNOSIS — R5383 Other fatigue: Secondary | ICD-10-CM

## 2012-09-03 LAB — VITAMIN D 25 HYDROXY (VIT D DEFICIENCY, FRACTURES): Vit D, 25-Hydroxy: 41 ng/mL (ref 30–89)

## 2012-09-03 NOTE — Patient Instructions (Addendum)
Always be careful and decrease the risk of falling paying attention to her what you do Increase fluid intake Use Mucinex as needed for cough Use better respiratory protection when mowing your yard [facility] with [facility] about [facility]anus shot Try cortisone 10, sparingly twice daily to both legs Try moisturelle  or Lubriderm as a moisturizer

## 2012-09-03 NOTE — Progress Notes (Signed)
  Subjective:    Patient ID: Tammy [name], female    DOB: [birth year], 72 y.o.   MRN: [id number]  HPI Patient returns to clinic today for followup of chronic medical problems. These include hypertension, hyperlipidemia, ASCVD, and osteoporosis. She also has GERD. She is status post aortic aneurysm repair. She is statin intolerant. Her health maintenance is not up to date and that she needs TDap. See the review of system. Patient is very stressed today because of an impending separation and divorce from her husband. Much of today's time was spent discussing all the issues going on between she and her husband.   Review of Systems  Constitutional: Positive for fatigue.  HENT: Positive for congestion (head), postnasal drip (slight) and sinus pressure.   Eyes: Positive for redness and itching (due to allergies).  Respiratory: Positive for cough (dry). Negative for shortness of breath.   Cardiovascular: Negative.   Gastrointestinal: Negative.   Endocrine: Negative.   Genitourinary: Positive for urgency, frequency and vaginal pain (irritation). Negative for vaginal discharge.  Musculoskeletal: Positive for arthralgias (bilateral hands). Back pain: LBP.  Skin: Negative.   Allergic/Immunologic: Positive for environmental allergies.  Neurological: Negative.   Psychiatric/Behavioral: Positive for sleep disturbance. Negative for suicidal ideas. The patient is nervous/anxious (due to family stressors).        Objective:   Physical Exam BP 146/88  Pulse 78  Temp(Src) 98.3 F (36.8 C) (Oral)  Ht 5\' 3"  (1.6 m)  Wt 146 lb 6.4 oz (66.407 kg)  BMI 25.94 kg/m2  The patient appeared well nourished and normally developed for her age, alert and oriented to time and place. Speech, behavior and judgement appear normal. Vital signs as documented.  Head exam is unremarkable. No scleral icterus or pallor noted.  Neck is without jugular venous distension, thyromegally, or carotid bruits. Carotid upstrokes are  brisk bilaterally. No cervical adenopathy. Lungs are clear anteriorly and posteriorly to auscultation. Normal respiratory effort. Cardiac exam reveals regular rate and rhythm at 84 per minute. First and second heart sounds normal.  No murmurs, rubs or gallops.  Abdominal exam reveals normal bowl sounds, no masses, no organomegaly and no aortic enlargement. No inguinal adenopathy. Extremities are nonedematous and both  pedal pulses are normal. Skin without pallor or jaundice.  Warm and dry. She has a fine petechial type rash on her lower extremities which have very dry skin. Neurologic exam reveals normal deep tendon reflexes and normal sensation.          Assessment & Plan:  Hypertension  Vitamin D deficiency  Fatigue  Hyperlipemia  Rash of lower extremities    Patient Instructions  Always be careful and decrease the risk of falling paying attention to her what you do Increase fluid intake Use Mucinex as needed for cough Use better respiratory protection when mowing your yard [facility] with [facility] about [facility] shot Try cortisone 10, sparingly twice daily to both legs Try moisturelle  or Lubriderm as a moisturizer   Labs reviewed with patient all information is back except for cholesterol portion.   Nyra Capes MD

## 2012-09-04 LAB — NMR LIPOPROFILE WITH LIPIDS
HDL Particle Number: 32.2 umol/L (ref >=30.5)
HDL Size: 8.7 nm — ABNORMAL LOW (ref >=9.2)
HDL-C: 54 mg/dL (ref >=40)
LDL (calc): 218 mg/dL — ABNORMAL HIGH (ref <100)
LDL Particle Number: 3058 nmol/L — ABNORMAL HIGH (ref <1000)
LDL Size: 21.1 nm (ref >20.5)
LP-IR Score: 39 (ref <=45)
Small LDL Particle Number: 1272 nmol/L — ABNORMAL HIGH (ref <=527)

## 2012-11-14 ENCOUNTER — Ambulatory Visit (INDEPENDENT_AMBULATORY_CARE_PROVIDER_SITE_OTHER): Payer: Medicare Other

## 2012-11-14 ENCOUNTER — Ambulatory Visit (INDEPENDENT_AMBULATORY_CARE_PROVIDER_SITE_OTHER): Payer: Medicare Other | Admitting: Family Medicine

## 2012-11-14 VITALS — BP 188/122 | HR 88 | Temp 98.1°F | Ht 60.0 in | Wt 147.0 lb

## 2012-11-14 DIAGNOSIS — M542 Cervicalgia: Secondary | ICD-10-CM | POA: Diagnosis not present

## 2012-11-14 DIAGNOSIS — M25519 Pain in unspecified shoulder: Secondary | ICD-10-CM | POA: Diagnosis not present

## 2012-11-14 DIAGNOSIS — M25512 Pain in left shoulder: Secondary | ICD-10-CM

## 2012-11-14 NOTE — Progress Notes (Signed)
  Subjective:    Patient ID: Tammy [name], female    DOB: 1941/03/05, 72 y.o.   MRN: [id number]  HPI Patient awoke this morning with a sore neck and superior shoulder. She mowed the yard with a riding mower. She was to the grocery store and picked up a 10 pound bag of cat food. She then developed severe left neck pain. She is intolerant of aspirin and NSAID.   Review of Systems  Constitutional: Negative.   HENT: Negative.   Eyes: Negative.   Respiratory: Negative.   Cardiovascular: Negative.   Gastrointestinal: Negative.   Endocrine: Negative.   Genitourinary: Negative.   Musculoskeletal: Positive for arthralgias (left side neck pain and shoulder- started this morning).  Skin: Negative.   Allergic/Immunologic: Negative.   Neurological: Negative.  Negative for headaches.  Hematological: Negative.   Psychiatric/Behavioral: Negative.        Objective:   Physical Exam  Vitals reviewed. Constitutional: She is oriented to person, place, and time. She appears well-developed and well-nourished. She appears distressed.  Because of and superior shoulder pain  HENT:  Head: Normocephalic.  Eyes: No scleral icterus.  Neck:  Limited movement of neck secondary to pain and posterior shoulder and superior scapula area  Cardiovascular:  Good radial pulses bilaterally  Musculoskeletal: She exhibits tenderness. She exhibits no edema.  Limited range of motion of neck but good grip bilaterally Tender left lateral neck and left superior scapula area  Neurological: She is alert and oriented to person, place, and time. She has normal reflexes.  Skin: No rash noted.  Psychiatric: She has a normal mood and affect. Her behavior is normal. Judgment and thought content normal.   [facility] reading (PRIMARY) by  Dr. Christell Constant: Cervical spine: Degenerative change and irregularity of alignment at C5-6                                          Assessment & Plan:  1. Neck pain - DG Cervical Spine 2 or 3 views;  Future -Flexeril 10 mg 3 times a day #30 called into drug store  2. Left shoulder pain  Patient Instructions  Use arm sling Use ice 20 minutes 3 or 4 times daily to left lateral neck and shoulder Take medication as directed Once x-rays are read by radiologist we will call and review those results with you   Nyra Capes MD

## 2012-11-14 NOTE — Patient Instructions (Signed)
Use arm sling Use ice 20 minutes 3 or 4 times daily to left lateral neck and shoulder Take medication as directed Once x-rays are read by radiologist we will call and review those results with you

## 2012-11-19 ENCOUNTER — Ambulatory Visit (INDEPENDENT_AMBULATORY_CARE_PROVIDER_SITE_OTHER): Payer: Medicare Other | Admitting: Family Medicine

## 2012-11-19 ENCOUNTER — Encounter: Payer: Self-pay | Admitting: Family Medicine

## 2012-11-19 VITALS — BP 149/90 | HR 66 | Temp 97.3°F | Ht 60.0 in | Wt 147.0 lb

## 2012-11-19 DIAGNOSIS — M25512 Pain in left shoulder: Secondary | ICD-10-CM

## 2012-11-19 DIAGNOSIS — M542 Cervicalgia: Secondary | ICD-10-CM

## 2012-11-19 DIAGNOSIS — M47812 Spondylosis without myelopathy or radiculopathy, cervical region: Secondary | ICD-10-CM | POA: Diagnosis not present

## 2012-11-19 DIAGNOSIS — M25519 Pain in unspecified shoulder: Secondary | ICD-10-CM | POA: Diagnosis not present

## 2012-11-19 DIAGNOSIS — G589 Mononeuropathy, unspecified: Secondary | ICD-10-CM

## 2012-11-19 DIAGNOSIS — I10 Essential (primary) hypertension: Secondary | ICD-10-CM | POA: Diagnosis not present

## 2012-11-19 DIAGNOSIS — G629 Polyneuropathy, unspecified: Secondary | ICD-10-CM

## 2012-11-19 NOTE — Patient Instructions (Signed)
Avoid lifting straining pushing pulling Start using warm wet compresses to neck and left shoulder 20 minutes 3 or 4 times daily We will obtain MRI of neck to further evaluate the persistence of neck pain and shoulder pain

## 2012-11-19 NOTE — Progress Notes (Signed)
  Subjective:    Patient ID: Tammy [name], female    DOB: 1940-10-19, 72 y.o.   MRN: [id number]  HPI The patient returns to clinic today for followup of neck and shoulder pain. She was intolerant of the muscle relaxer Flexeril. She did have some pain medication at home which she took and this relieved the pain somewhat and she says she is better today.The plain film x-rays of her cervical spine showed a lot of degenerative changes with moderate to severe neural foraminal narrowing bilaterally. The patient says that she is improved, but still having pain in the neck and the left shoulder. The x-ray results were reviewed with the patient today.   Review of Systems  Constitutional: Negative.   HENT: Positive for neck stiffness (better today).   Eyes: Negative.   Respiratory: Negative.   Cardiovascular: Negative.   Gastrointestinal: Negative.   Endocrine: Negative.   Genitourinary: Negative.   Skin: Negative.   Allergic/Immunologic: Negative.   Neurological: Negative.   Hematological: Negative.   Psychiatric/Behavioral: Negative.        Objective:   Physical Exam  Nursing note and vitals reviewed. Constitutional: She is oriented to person, place, and time. She appears well-developed and well-nourished. No distress.  For her age  HENT:  Head: Normocephalic and atraumatic.  Eyes: Right eye exhibits no discharge. Left eye exhibits no discharge. Scleral icterus is present.  Neck:  Better range of motion today with less pain than initially  Musculoskeletal: She exhibits no edema and no tenderness.  Range of motion in lower extremities normal  Neurological: She is alert and oriented to person, place, and time. She has normal reflexes. She displays normal reflexes. No cranial nerve deficit. Coordination normal.  Skin: Skin is warm and dry. No rash noted.  Psychiatric: She has a normal mood and affect. Her behavior is normal. Thought content normal.          Assessment & Plan:  1. Neck  pain -MRI of C-spine  2. Left shoulder pain -MRI of C-spine  3. Hypertension -Continue to take medications regularly and watch sodium and  4. Osteoarthritis cervical spine  5. Neuropathy  Patient Instructions  Avoid lifting straining pushing pulling Start using warm wet compresses to neck and left shoulder 20 minutes 3 or 4 times daily We will obtain MRI of neck to further evaluate the persistence of neck pain and shoulder pain    Nyra Capes MD

## 2012-11-27 ENCOUNTER — Other Ambulatory Visit: Payer: Self-pay | Admitting: *Deleted

## 2012-11-27 DIAGNOSIS — IMO0002 Reserved for concepts with insufficient information to code with codable children: Secondary | ICD-10-CM

## 2012-11-27 DIAGNOSIS — M542 Cervicalgia: Secondary | ICD-10-CM

## 2012-11-27 DIAGNOSIS — G629 Polyneuropathy, unspecified: Secondary | ICD-10-CM

## 2012-12-03 ENCOUNTER — Ambulatory Visit (HOSPITAL_COMMUNITY)
Admission: RE | Admit: 2012-12-03 | Discharge: 2012-12-03 | Disposition: A | Discharge status: Home / Self Care | Payer: Medicare Other | Source: Ambulatory Visit | Attending: Family Medicine | Admitting: Family Medicine

## 2012-12-03 DIAGNOSIS — M542 Cervicalgia: Secondary | ICD-10-CM

## 2012-12-03 DIAGNOSIS — G629 Polyneuropathy, unspecified: Secondary | ICD-10-CM

## 2012-12-03 DIAGNOSIS — IMO0002 Reserved for concepts with insufficient information to code with codable children: Secondary | ICD-10-CM

## 2012-12-05 ENCOUNTER — Telehealth: Payer: Self-pay | Admitting: *Deleted

## 2012-12-05 ENCOUNTER — Encounter (HOSPITAL_COMMUNITY): Payer: Self-pay

## 2012-12-05 ENCOUNTER — Ambulatory Visit (HOSPITAL_COMMUNITY)
Admission: RE | Admit: 2012-12-05 | Discharge: 2012-12-05 | Disposition: A | Discharge status: Home / Self Care | Payer: Medicare Other | Source: Ambulatory Visit | Attending: Family Medicine | Admitting: Family Medicine

## 2012-12-05 DIAGNOSIS — M47812 Spondylosis without myelopathy or radiculopathy, cervical region: Secondary | ICD-10-CM | POA: Diagnosis not present

## 2012-12-05 DIAGNOSIS — M25519 Pain in unspecified shoulder: Secondary | ICD-10-CM | POA: Insufficient documentation

## 2012-12-05 DIAGNOSIS — M5030 Other cervical disc degeneration, unspecified cervical region: Secondary | ICD-10-CM | POA: Insufficient documentation

## 2012-12-05 DIAGNOSIS — IMO0002 Reserved for concepts with insufficient information to code with codable children: Secondary | ICD-10-CM | POA: Diagnosis not present

## 2012-12-05 DIAGNOSIS — M4802 Spinal stenosis, cervical region: Secondary | ICD-10-CM | POA: Diagnosis not present

## 2012-12-05 DIAGNOSIS — X500XXA Overexertion from strenuous movement or load, initial encounter: Secondary | ICD-10-CM | POA: Insufficient documentation

## 2012-12-06 ENCOUNTER — Telehealth: Payer: Self-pay | Admitting: *Deleted

## 2012-12-06 ENCOUNTER — Other Ambulatory Visit: Payer: Self-pay | Admitting: *Deleted

## 2012-12-06 DIAGNOSIS — M1990 Unspecified osteoarthritis, unspecified site: Secondary | ICD-10-CM

## 2012-12-06 MED ORDER — PREDNISONE 10 MG PO KIT: 1.0000 | PACK | Freq: Four times a day (QID) | ORAL | Status: DC

## 2012-12-06 NOTE — Telephone Encounter (Signed)
Message copied by Bearl Mulberry on Fri Dec 06, 2012  5:21 PM ------      Message from: Tammy [name]      Created: Thu Dec 05, 2012  5:33 PM       Call and see how patient is doing if not better prednisone 10 taper for 8 days, if better no prednisone            Give the patient the results of this report ------

## 2012-12-06 NOTE — Progress Notes (Signed)
Patient aware that [facility] [place].

## 2012-12-06 NOTE — Telephone Encounter (Signed)
Pt notified of results Pain is better Verbalizes understanding

## 2012-12-06 NOTE — Telephone Encounter (Signed)
Discussed results with patient

## 2012-12-06 NOTE — Telephone Encounter (Signed)
LMOM

## 2012-12-07 ENCOUNTER — Ambulatory Visit: Payer: Medicare Other

## 2012-12-09 ENCOUNTER — Other Ambulatory Visit (INDEPENDENT_AMBULATORY_CARE_PROVIDER_SITE_OTHER): Payer: Medicare Other

## 2012-12-09 DIAGNOSIS — N390 Urinary tract infection, site not specified: Secondary | ICD-10-CM

## 2012-12-09 LAB — POCT URINALYSIS DIPSTICK
Bilirubin, UA: NEGATIVE
Glucose, UA: NEGATIVE
Ketones, UA: NEGATIVE
Protein, UA: NEGATIVE
Spec Grav, UA: 1.02
pH, UA: 6.5

## 2012-12-09 LAB — POCT UA - MICROSCOPIC ONLY
Crystals, Ur, HPF, POC: NEGATIVE
RBC, urine, microscopic: NEGATIVE

## 2012-12-09 NOTE — Progress Notes (Signed)
Pt droppped off urine only

## 2012-12-11 ENCOUNTER — Other Ambulatory Visit (INDEPENDENT_AMBULATORY_CARE_PROVIDER_SITE_OTHER): Payer: Medicare Other

## 2012-12-11 DIAGNOSIS — N390 Urinary tract infection, site not specified: Secondary | ICD-10-CM | POA: Diagnosis not present

## 2012-12-11 LAB — POCT URINALYSIS DIPSTICK
Bilirubin, UA: NEGATIVE
Glucose, UA: NEGATIVE
Nitrite, UA: POSITIVE
Urobilinogen, UA: NEGATIVE

## 2012-12-11 LAB — POCT UA - MICROSCOPIC ONLY
Casts, Ur, LPF, POC: NEGATIVE
Yeast, UA: NEGATIVE

## 2012-12-11 NOTE — Progress Notes (Signed)
Pt dropped off urine specimen

## 2012-12-12 NOTE — Telephone Encounter (Signed)
LMOM

## 2012-12-13 ENCOUNTER — Other Ambulatory Visit: Payer: Self-pay | Admitting: *Deleted

## 2012-12-13 ENCOUNTER — Telehealth: Payer: Self-pay | Admitting: *Deleted

## 2012-12-13 LAB — URINE CULTURE

## 2012-12-13 MED ORDER — CIPROFLOXACIN HCL 500 MG PO TABS: 500.0000 mg | ORAL_TABLET | Freq: Two times a day (BID) | ORAL | Status: DC

## 2012-12-13 NOTE — Telephone Encounter (Signed)
Dr. Christell Constant called Tammy [name] and asked that Cipro 500 mg bid for 7 days be called in for this pt. [facility], and notified pt to rck urine in 7-10 days

## 2012-12-17 ENCOUNTER — Telehealth: Payer: Self-pay | Admitting: Family Medicine

## 2012-12-31 ENCOUNTER — Other Ambulatory Visit (INDEPENDENT_AMBULATORY_CARE_PROVIDER_SITE_OTHER): Payer: Medicare Other

## 2012-12-31 DIAGNOSIS — N390 Urinary tract infection, site not specified: Secondary | ICD-10-CM

## 2012-12-31 LAB — POCT UA - MICROSCOPIC ONLY
Casts, Ur, LPF, POC: NEGATIVE
Crystals, Ur, HPF, POC: NEGATIVE
Mucus, UA: NEGATIVE

## 2012-12-31 LAB — POCT URINALYSIS DIPSTICK
Blood, UA: NEGATIVE
Glucose, UA: NEGATIVE
Ketones, UA: NEGATIVE
Nitrite, UA: NEGATIVE
Protein, UA: NEGATIVE
Spec Grav, UA: 1.01
Urobilinogen, UA: NEGATIVE
pH, UA: 7.5

## 2013-01-02 ENCOUNTER — Telehealth: Payer: Self-pay | Admitting: *Deleted

## 2013-01-02 MED ORDER — FLUCONAZOLE 150 MG PO TABS: 150.0000 mg | ORAL_TABLET | Freq: Every day | ORAL | Status: DC

## 2013-01-02 NOTE — Telephone Encounter (Signed)
Order for Diflucan sent to Pharm. Patient aware. She will f/u with [place] if symptoms worsen or persist.

## 2013-01-02 NOTE — Telephone Encounter (Signed)
Diflucan 150 #2 one daily for 2 days

## 2013-01-02 NOTE — Telephone Encounter (Signed)
Patient has some vaginal itching and irritation.  Recent antibiotic use. Patient has used Diflucan in the past with good results.

## 2013-02-10 ENCOUNTER — Ambulatory Visit: Payer: Medicare Other | Admitting: Family Medicine

## 2013-02-12 ENCOUNTER — Encounter: Payer: Self-pay | Admitting: Family Medicine

## 2013-02-12 ENCOUNTER — Ambulatory Visit (INDEPENDENT_AMBULATORY_CARE_PROVIDER_SITE_OTHER): Payer: Medicare Other

## 2013-02-12 ENCOUNTER — Ambulatory Visit (INDEPENDENT_AMBULATORY_CARE_PROVIDER_SITE_OTHER): Payer: Medicare Other | Admitting: Family Medicine

## 2013-02-12 VITALS — BP 151/93 | HR 66 | Temp 98.0°F | Ht 60.0 in | Wt 145.0 lb

## 2013-02-12 DIAGNOSIS — M545 Low back pain, unspecified: Secondary | ICD-10-CM

## 2013-02-12 DIAGNOSIS — R05 Cough: Secondary | ICD-10-CM

## 2013-02-12 DIAGNOSIS — R32 Unspecified urinary incontinence: Secondary | ICD-10-CM

## 2013-02-12 DIAGNOSIS — R059 Cough, unspecified: Secondary | ICD-10-CM

## 2013-02-12 DIAGNOSIS — N899 Noninflammatory disorder of vagina, unspecified: Secondary | ICD-10-CM

## 2013-02-12 DIAGNOSIS — E7800 Pure hypercholesterolemia, unspecified: Secondary | ICD-10-CM

## 2013-02-12 DIAGNOSIS — R350 Frequency of micturition: Secondary | ICD-10-CM

## 2013-02-12 DIAGNOSIS — I10 Essential (primary) hypertension: Secondary | ICD-10-CM

## 2013-02-12 DIAGNOSIS — E559 Vitamin D deficiency, unspecified: Secondary | ICD-10-CM | POA: Diagnosis not present

## 2013-02-12 DIAGNOSIS — N898 Other specified noninflammatory disorders of vagina: Secondary | ICD-10-CM

## 2013-02-12 DIAGNOSIS — Z780 Asymptomatic menopausal state: Secondary | ICD-10-CM | POA: Diagnosis not present

## 2013-02-12 LAB — POCT CBC
Granulocyte percent: 64.9 %G (ref 37–80)
HCT, POC: 44.6 % (ref 37.7–47.9)
MCV: 83.4 fL (ref 80–97)
MPV: 8.1 fL (ref 0–99.8)
POC Granulocyte: 3.7 (ref 2–6.9)
POC LYMPH PERCENT: 30.1 %L (ref 10–50)
Platelet Count, POC: 232 10*3/uL (ref 142–424)
RBC: 5.4 M/uL (ref 4.04–5.48)
RDW, POC: 14.3 %
WBC: 5.7 10*3/uL (ref 4.6–10.2)

## 2013-02-12 NOTE — Progress Notes (Signed)
Subjective:    Patient ID: Tammy [name], female    DOB: 1940/03/25, 72 y.o.   MRN: [id number]  HPI Pt here for follow up and management of chronic medical problems. Comes by herself to this visit. She continues to complain of back pain. She also complains of some upper airway congestion. Her health maintenance issues are up-to-date. The last urinalysis that was checked the end of October indicated that urine was clear and was without infection .  Patient also complains of a lot of stress in her life. She indicates that even though the urine was clear at the end of October, that she continues to have in creased frequency, urgency, and pressure. She is using some Monistat vaginal pain and some vaginal irritation. She also complains of nasal congestion left greater than right. The drainage is clear. She also complains of increased wheezing and coughing. On reviewing her record her last chest x-ray in February of this year was stable. She is not able to check her blood pressures at home but will purchase a monitor.       Patient Active Problem List   Diagnosis Date Noted  . HYPERCHOLESTEROLEMIA 08/06/2008  . HYPERTENSION 08/06/2008   Outpatient Encounter Prescriptions as of 02/12/2013  Medication Sig  . amLODipine-valsartan (EXFORGE) 5-320 MG per tablet Take 1 tablet by mouth daily.  . Omega-3 Fatty Acids (FISH OIL) 1000 MG CAPS Take 2 capsules by mouth daily.  Marland Kitchen omeprazole (PRILOSEC) 20 MG capsule Take 20 mg by mouth 2 (two) times daily.  . [DISCONTINUED] ciprofloxacin (CIPRO) 500 MG tablet Take 1 tablet (500 mg total) by mouth 2 (two) times daily.  . [DISCONTINUED] fluconazole (DIFLUCAN) 150 MG tablet Take 1 tablet (150 mg total) by mouth daily.  . [DISCONTINUED] PredniSONE 10 MG KIT Take 1 kit (10 mg total) by mouth taper from 4 doses each day to 1 dose and stop.    Review of Systems  Constitutional: Negative.   HENT: Positive for postnasal drip.   Eyes: Negative.   Respiratory: Positive  for cough and shortness of breath (little).   Cardiovascular: Negative.   Gastrointestinal: Negative.   Endocrine: Negative.   Genitourinary: Positive for frequency and vaginal pain (blisters since antibiotic).  Musculoskeletal: Positive for back pain (kidney area).  Skin: Negative.   Allergic/Immunologic: Negative.   Neurological: Negative.   Hematological: Negative.   Psychiatric/Behavioral: Negative.        Objective:   Physical Exam  Nursing note and vitals reviewed. Constitutional: She is oriented to person, place, and time. She appears well-developed and well-nourished. No distress.  HENT:  Head: Normocephalic and atraumatic.  Right Ear: External ear normal.  Left Ear: External ear normal.  Mouth/Throat: Oropharynx is clear and moist.  Nasal congestion bilaterally left greater than right  Eyes: Conjunctivae and EOM are normal. Pupils are equal, round, and reactive to light. Right eye exhibits no discharge. Left eye exhibits no discharge. No scleral icterus.  Neck: Normal range of motion. Neck supple. No thyromegaly present.  Cardiovascular: Normal rate, regular rhythm, normal heart sounds and intact distal pulses.  Exam reveals no gallop and no friction rub.   No murmur heard. At 72 per minute  Pulmonary/Chest: Effort normal and breath sounds normal. No respiratory distress. She has no wheezes. She has no rales. She exhibits no tenderness.  Dry cough  Abdominal: Soft. Bowel sounds are normal. She exhibits no mass. There is tenderness. There is no rebound and no guarding.  Tenderness left upper quadrant, most likely  secondary to coughing and previous abdominal surgery  Musculoskeletal: Normal range of motion. She exhibits no edema and no tenderness.   Leg raising causes back pain in the lumbar area  Lymphadenopathy:    She has no cervical adenopathy.  Neurological: She is alert and oriented to person, place, and time. She has normal reflexes. No cranial nerve deficit.    Skin: Skin is warm and dry.  Psychiatric: She has a normal mood and affect. Her behavior is normal. Judgment and thought content normal.   BP 151/93  Pulse 66  Temp(Src) 98 F (36.7 C) (Oral)  Ht 5' (1.524 m)  Wt 145 lb (65.772 kg)  BMI 28.32 kg/m2        Assessment & Plan:  1. HYPERCHOLESTEROLEMIA - POCT CBC - NMR, lipoprofile  2. HYPERTENSION - POCT CBC - Hepatic function panel - BMP8+EGFR  3. Postmenopausal - DG Bone Density; Future  4. Vitamin D deficiency - Vit D  25 hydroxy (rtn osteoporosis monitoring)  5. Urinary frequency - POCT urinalysis dipstick - POCT UA - Microscopic Only  6. Urinary incontinence - POCT urinalysis dipstick - POCT UA - Microscopic Only  7. Low back pain - DG Lumbar Spine Complete; Future  8. Vaginal irritation - POCT Wet Prep with KOH  9. Cough - DG Chest 2 View; Future  Meds ordered this encounter  Medications  . Omega-3 Fatty Acids (FISH OIL) 1000 MG CAPS    Sig: Take 2 capsules by mouth daily.   Patient Instructions  Continue current medications. Continue good therapeutic lifestyle changes which include good diet and exercise. Fall precautions discussed with patient. Schedule your flu vaccine if you haven't had it yet If you are over 4 years old - you may need Prevnar 13 or the adult Pneumonia vaccine. Please drink plenty of fluids Take Mucinex maximum strength with white in color, over-the-counter one twice daily with a large glass of water Use a cool mist humidifier Please monitor blood pressures at home and return blood pressure readings to me in 2-3 week We will arrange an appointment for you with her urologist because of your persistent urinary tract symptoms    Nyra Capes MD

## 2013-02-12 NOTE — Addendum Note (Signed)
Addended by: Magdalene River on: 02/12/2013 09:09 AM   Modules accepted: Orders

## 2013-02-12 NOTE — Addendum Note (Signed)
Addended by: Magdalene River on: 02/12/2013 09:16 AM   Modules accepted: Orders

## 2013-02-12 NOTE — Patient Instructions (Addendum)
Continue current medications. Continue good therapeutic lifestyle changes which include good diet and exercise. Fall precautions discussed with patient. Schedule your flu vaccine if you haven't had it yet If you are over 73 years old - you may need Prevnar 13 or the adult Pneumonia vaccine. Please drink plenty of fluids Take Mucinex maximum strength with white in color, over-the-counter one twice daily with a large glass of water Use a cool mist humidifier Please monitor blood pressures at home and return blood pressure readings to me in 2-3 week We will arrange an appointment for you with her urologist because of your persistent urinary tract symptoms

## 2013-02-14 ENCOUNTER — Other Ambulatory Visit (INDEPENDENT_AMBULATORY_CARE_PROVIDER_SITE_OTHER): Payer: Medicare Other

## 2013-02-14 DIAGNOSIS — Z1212 Encounter for screening for malignant neoplasm of rectum: Secondary | ICD-10-CM

## 2013-02-14 LAB — HEPATIC FUNCTION PANEL
Albumin: 4.3 g/dL (ref 3.5–4.8)
Alkaline Phosphatase: 81 IU/L (ref 39–117)
Total Bilirubin: 0.5 mg/dL (ref 0.0–1.2)
Total Protein: 6.6 g/dL (ref 6.0–8.5)

## 2013-02-14 LAB — BMP8+EGFR
BUN/Creatinine Ratio: 12 (ref 11–26)
BUN: 10 mg/dL (ref 8–27)
Chloride: 101 mmol/L (ref 97–108)
Creatinine, Ser: 0.82 mg/dL (ref 0.57–1.00)
GFR calc Af Amer: 82 mL/min/{1.73_m2} (ref >59)
Glucose: 83 mg/dL (ref 65–99)
Sodium: 141 mmol/L (ref 134–144)

## 2013-02-14 LAB — NMR, LIPOPROFILE
Cholesterol: 323 mg/dL — ABNORMAL HIGH (ref <200)
HDL Cholesterol by NMR: 61 mg/dL (ref >=40)
HDL Particle Number: 37.9 umol/L (ref >=30.5)
LDL Size: 20.9 nm (ref >20.5)
LDLC SERPL CALC-MCNC: 232 mg/dL — ABNORMAL HIGH (ref <100)
LP-IR Score: 44 (ref <=45)
Small LDL Particle Number: 1310 nmol/L — ABNORMAL HIGH (ref <=527)
Triglycerides by NMR: 149 mg/dL (ref <150)

## 2013-02-14 NOTE — Progress Notes (Signed)
Pt dropped off FOBT only 

## 2013-02-16 LAB — FECAL OCCULT BLOOD, IMMUNOCHEMICAL: Fecal Occult Bld: POSITIVE — AB

## 2013-02-21 ENCOUNTER — Telehealth: Payer: Self-pay | Admitting: Family Medicine

## 2013-03-03 ENCOUNTER — Other Ambulatory Visit: Payer: Self-pay | Admitting: *Deleted

## 2013-03-03 ENCOUNTER — Other Ambulatory Visit (INDEPENDENT_AMBULATORY_CARE_PROVIDER_SITE_OTHER): Payer: Medicare Other

## 2013-03-03 DIAGNOSIS — I10 Essential (primary) hypertension: Secondary | ICD-10-CM | POA: Diagnosis not present

## 2013-03-03 DIAGNOSIS — N390 Urinary tract infection, site not specified: Secondary | ICD-10-CM

## 2013-03-03 DIAGNOSIS — R195 Other fecal abnormalities: Secondary | ICD-10-CM

## 2013-03-03 LAB — POCT UA - MICROSCOPIC ONLY
Bacteria, U Microscopic: NEGATIVE
Casts, Ur, LPF, POC: NEGATIVE
Crystals, Ur, HPF, POC: NEGATIVE
Mucus, UA: NEGATIVE
Yeast, UA: NEGATIVE

## 2013-03-03 LAB — POCT URINALYSIS DIPSTICK
Blood, UA: NEGATIVE
Glucose, UA: NEGATIVE
Ketones, UA: NEGATIVE
Nitrite, UA: NEGATIVE
Protein, UA: NEGATIVE
Spec Grav, UA: <=1.005
Urobilinogen, UA: NEGATIVE
pH, UA: 7

## 2013-03-03 LAB — POCT WET PREP (WET MOUNT)

## 2013-03-03 NOTE — Progress Notes (Signed)
Pt came in for labs only 

## 2013-03-04 LAB — CBC WITH DIFFERENTIAL

## 2013-03-04 LAB — BMP8+EGFR
BUN/Creatinine Ratio: 11 (ref 11–26)
BUN: 9 mg/dL (ref 8–27)
CO2: 22 mmol/L (ref 18–29)
Chloride: 102 mmol/L (ref 97–108)
Creatinine, Ser: 0.8 mg/dL (ref 0.57–1.00)
GFR calc Af Amer: 85 mL/min/{1.73_m2} (ref >59)
GFR calc non Af Amer: 73 mL/min/{1.73_m2} (ref >59)
Glucose: 82 mg/dL (ref 65–99)
Potassium: 4.3 mmol/L (ref 3.5–5.2)
Sodium: 143 mmol/L (ref 134–144)

## 2013-03-04 LAB — PTH, INTACT AND CALCIUM: PTH: 74 pg/mL — ABNORMAL HIGH (ref 15–65)

## 2013-03-07 ENCOUNTER — Other Ambulatory Visit (INDEPENDENT_AMBULATORY_CARE_PROVIDER_SITE_OTHER): Payer: Medicare Other

## 2013-03-07 DIAGNOSIS — Z1212 Encounter for screening for malignant neoplasm of rectum: Secondary | ICD-10-CM | POA: Diagnosis not present

## 2013-03-07 NOTE — Progress Notes (Signed)
Pt dropped off FOBT only 

## 2013-03-09 LAB — FECAL OCCULT BLOOD, IMMUNOCHEMICAL: Fecal Occult Bld: POSITIVE — AB

## 2013-03-12 DIAGNOSIS — Z1231 Encounter for screening mammogram for malignant neoplasm of breast: Secondary | ICD-10-CM | POA: Diagnosis not present

## 2013-03-13 DIAGNOSIS — R82998 Other abnormal findings in urine: Secondary | ICD-10-CM | POA: Diagnosis not present

## 2013-03-13 DIAGNOSIS — R3915 Urgency of urination: Secondary | ICD-10-CM | POA: Diagnosis not present

## 2013-03-13 DIAGNOSIS — R350 Frequency of micturition: Secondary | ICD-10-CM | POA: Diagnosis not present

## 2013-03-14 ENCOUNTER — Telehealth: Payer: Self-pay | Admitting: *Deleted

## 2013-03-14 DIAGNOSIS — K921 Melena: Secondary | ICD-10-CM

## 2013-03-19 NOTE — Telephone Encounter (Signed)
Message copied by Thana Ates on Wed Mar 19, 2013  4:20 PM ------      Message from: Chipper Herb      Created: Tue Mar 04, 2013  1:58 PM       Blood sugar renal and electrolytes are within normal limits. The serum calcium remains elevated. It is slightly decreased from the previous check. Is 10.4.      The serum phosphorus is within normal limits. 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

## 2013-03-26 ENCOUNTER — Ambulatory Visit (INDEPENDENT_AMBULATORY_CARE_PROVIDER_SITE_OTHER): Payer: Medicare Other | Admitting: Pharmacist

## 2013-03-26 ENCOUNTER — Ambulatory Visit (INDEPENDENT_AMBULATORY_CARE_PROVIDER_SITE_OTHER): Payer: Medicare Other

## 2013-03-26 ENCOUNTER — Other Ambulatory Visit: Payer: Self-pay | Admitting: *Deleted

## 2013-03-26 ENCOUNTER — Encounter: Payer: Self-pay | Admitting: Pharmacist

## 2013-03-26 ENCOUNTER — Encounter (INDEPENDENT_AMBULATORY_CARE_PROVIDER_SITE_OTHER): Payer: Self-pay

## 2013-03-26 VITALS — BP 142/82 | HR 70 | Ht 60.0 in | Wt 146.5 lb

## 2013-03-26 DIAGNOSIS — Z780 Asymptomatic menopausal state: Secondary | ICD-10-CM

## 2013-03-26 DIAGNOSIS — Z0000 Encounter for general adult medical examination without abnormal findings: Secondary | ICD-10-CM | POA: Diagnosis not present

## 2013-03-26 DIAGNOSIS — M810 Age-related osteoporosis without current pathological fracture: Secondary | ICD-10-CM

## 2013-03-26 DIAGNOSIS — Z789 Other specified health status: Secondary | ICD-10-CM | POA: Insufficient documentation

## 2013-03-26 DIAGNOSIS — E349 Endocrine disorder, unspecified: Secondary | ICD-10-CM

## 2013-03-26 DIAGNOSIS — K219 Gastro-esophageal reflux disease without esophagitis: Secondary | ICD-10-CM | POA: Insufficient documentation

## 2013-03-26 DIAGNOSIS — T458X5A Adverse effect of other primarily systemic and hematological agents, initial encounter: Secondary | ICD-10-CM

## 2013-03-26 NOTE — Progress Notes (Signed)
Patient is willing to go to endo and so i went ahead and set the referral up

## 2013-03-26 NOTE — Progress Notes (Signed)
Patient ID: Tammy GilbertSue [name], female   DOB: 01/18/1941, 73 y.o.   MRN: [id number] Subjective:    Tammy GilbertSue [name] is a 73 y.o. female who presents for Medicare Initial preventive examination and review DEXA results.  Preventive Screening-Counseling & Management  Tobacco History  Smoking status  . Former Smoker  . Types: Cigarettes  . Start date: 09/04/2003  Smokeless tobacco  . Never Used      Current Problems (verified) Patient Active Problem List   Diagnosis Date Noted  . 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 GERD (gastroesophageal reflux disease) 03/26/2013  . Statin intolerance 03/26/2013  . Oral bisphosphonates causing adverse effect in therapeutic use 03/26/2013  . HYPERCHOLESTEROLEMIA 08/06/2008  . HYPERTENSION 08/06/2008    Medications Prior to Visit Current Outpatient Prescriptions on File Prior to Visit  Medication Sig Dispense Refill  . amLODipine-valsartan (EXFORGE) 5-320 MG per tablet Take 1 tablet by mouth daily.      . Omega-3 Fatty Acids (FISH OIL) 1000 MG CAPS Take 2 capsules by mouth daily.      Marland Kitchen. omeprazole (PRILOSEC) 20 MG capsule Take 20 mg by mouth 2 (two) times daily.       No current facility-administered medications on file prior to visit.    Current Medications (verified) Current Outpatient Prescriptions  Medication Sig Dispense Refill  . amLODipine-valsartan (EXFORGE) 5-320 MG per tablet Take 1 tablet by mouth daily.      . Omega-3 Fatty Acids (FISH OIL) 1000 MG CAPS Take 2 capsules by mouth daily.      Marland Kitchen. omeprazole (PRILOSEC) 20 MG capsule Take 20 mg by mouth 2 (two) times daily.       No current facility-administered medications for this visit.     Allergies (verified) Ivp dye; Morphine and related; Aspirin; Flexeril; Iodine; Nsaids; Boniva; Fosamax; Livalo; and Simvastatin   PAST HISTORY  Family History Family History  Problem Relation Age of Onset  . Stroke Father   . Arthritis Father   . Hyperlipidemia Mother   . Stroke Mother   .  Heart attack Brother   . Cancer Brother     kidney and lung  . COPD Brother   . Heart attack Brother     Social History History  Substance Use Topics  . Smoking status: Former Smoker    Types: Cigarettes    Start date: 09/04/2003  . Smokeless tobacco: Never Used  . Alcohol Use: No     Are there smokers in your home (other than you)? No  Risk Factors Current exercise habits: Home exercise routine includes walking 1 hrs per day.  Dietary issues discussed:  Limiting fat   Cardiac risk factors: advanced age (older than 7555 for men, 4465 for women), dyslipidemia, family history of premature cardiovascular disease and hypertension.  Depression Screen (Note: if answer to either of the following is "Yes", a more complete depression screening is indicated). Patient is going through a divorce with lots of issues.  Currently going through mediation but she is handling well.   Over the past 2 weeks, have you felt down, depressed or hopeless? No  Over the past 2 weeks, have you felt little interest or pleasure in doing things? No  Have you lost interest or pleasure in daily life? No  Do you often feel hopeless? No  Do you cry easily over simple problems? No  Activities of Daily Living In your present state of health, do you have any difficulty performing the following activities?:  Driving? No Managing money?  No Feeding  yourself? No Getting from bed to chair? No Climbing a flight of stairs? No Preparing food and eating?: No Bathing or showering? No Getting dressed: No Getting to the toilet? No Using the toilet:No Moving around from place to place: No In the past year have you fallen or had a near fall?:No   Are you sexually active?  No  Do you have more than one partner?  No  Hearing Difficulties: No Do you often ask people to speak up or repeat themselves? No Do you experience ringing or noises in your ears? No Do you have difficulty understanding soft or whispered voices?  No   Do you feel that you have a problem with memory? No  Do you often misplace items? No  Do you feel safe at home?  Yes  Cognitive Testing  Alert? Yes  Normal Appearance?Yes  Oriented to person? Yes  Place? Yes   Time? Yes  Recall of three objects?  Yes  Can perform simple calculations? Yes  Displays appropriate judgment?Yes  Can read the correct time from a watch face?Yes   Advanced Directives have been discussed with the patient? Yes  List the Names of Other Physician/Practitioners you currently use: 1.  Goes to [place] in [place], [place] 2.  [facility] - cardiologist 3.  My eye doctor - [facility] for eye care   There is no immunization history on file for this patient.  Screening Tests Health Maintenance  Topic Date Due  . Pneumococcal Polysaccharide Vaccine Age 94 And Over  09/03/2013  . Influenza Vaccine  11/04/2013  . Colonoscopy  08/05/2014  . Mammogram  03/13/2015  . Tetanus/tdap  10/05/2022  . Zostavax  Completed   DEXA performed today 03/26/13 Last eye exam about 1 year ago - patient believes glaucoma screening performed but not sure.  HPI: Does pt already have a diagnosis of:  Osteopenia?  No Osteoporosis?  Yes  Back Pain?  Yes       Kyphosis?  No Prior fracture?  Yes - fractured spine in past Med(s) for Osteoporosis/Osteopenia:  None currently Med(s) previously tried for Osteoporosis/Osteopenia:  Fosamax and boniva - both caused arthalgias.  Sent information to [place] requesting Reclast but patient never started.                                                                All answers were reviewed with the patient and necessary referrals were made:  Cherre Robins, [facility]   03/26/2013   History reviewed: allergies, current medications, past family history, past medical history, past social history, past surgical history and problem list   Objective:      Body mass index is 28.61 kg/(m^2). BP 142/82  Pulse 70  Ht 5' (1.524 m)  Wt 146 lb 8 oz (66.452 kg)   BMI 28.61 kg/m2  Calcium Assessment Calcium Intake  # of servings/day  Calcium mg  Milk (8 oz) 0  x  300  = 0  Yogurt (4 oz) 1 x  200 = 200mg   Cheese (1 oz) 0 x  200 = 0  Other Calcium sources   250mg   Ca supplement Tums tid = 1500mg    Estimated calcium intake per day 19500mg     DEXA Results Date of Test T-Score for AP Spine L1-L4 T-Score for  Total Left Hip T-Score for Total Right Hip  03/26/2013 -1.9 -3.5 -3.5  12/14/2010 -1.7 -3.0 -2.7  04/15/2008 -2.1 -2.9 -2.6  04/11/2004 -2.5 -2.8 --       Assessment:     Annual Medicare Wellness Visit Osteoporosis with history of intolerance to fosamax and boniva     Plan:     During the course of the visit the patient was educated and counseled about appropriate screening and preventive services including:    Pneumococcal vaccine   Influenza vaccine  Hepatitis B vaccine  Td vaccine  Screening mammography  Screening Pap smear and pelvic exam   Bone densitometry screening  Colorectal cancer screening  Glaucoma screening  Nutrition counseling   Advanced directives: has NO advanced directive - not interested in additional information  Diet review for nutrition referral? Patient refused Request for copy of last eye exam made.  Patient has appt at [facility] in 2 weeks.  She is taking copy of DEXA and will discuss treatment options available (Reclast, Prolia and Forteo)  Recommend calcium 1200mg  daily through supplementation or diet. (discussed decreasing to 2 Tums daily) Continue weight bearing exercise - 30 minutes at least 4 days  per week.   Counseled and educated about fall risk and prevention.  Recheck DEXA:  2 years   Patient Instructions (the written plan) was given to the patient.  Medicare Attestation I have personally reviewed: The patient's medical and social history Their use of alcohol, tobacco or illicit drugs Their current medications and supplements The patient's functional ability including  ADLs,fall risks, home safety risks, cognitive, and hearing and visual impairment Diet and physical activities Evidence for depression or mood disorders  The patient's weight, height, BMI, and visual acuity have been recorded in the chart.  I have made referrals, counseling, and provided education to the patient based on review of the above and I have provided the patient with a written personalized care plan for preventive services.     Cherre Robins, [facility]   03/26/2013

## 2013-03-26 NOTE — Patient Instructions (Addendum)
Health Maintenance Summary    PNEUMOCOCCAL POLYSACCHARIDE VACCINE AGE 74 AND OVER Postponed 09/03/2013 Postponed until 09/03/2013. Patient Declined    INFLUENZA VACCINE Postponed 11/04/2013 Postponed until 11/04/2013. Patient Declined    COLONOSCOPY Next Due 08/05/2014  Done 08/2004    MAMMOGRAM Next Due 03/13/2015  Done 03/12/2013   DEXA Next Due 03/27/2015 Done 03/26/2013   Zostavax completed  Done 05/2011   PAP Smear Next Due  03/2013  Patient already has appt with OB/GYN    TETANUS/TDAP Next Due 10/05/2022        Fall Prevention and Home Safety Falls cause injuries and can affect all age groups. It is possible to use preventive measures to significantly decrease the likelihood of falls. There are many simple measures which can make your home safer and prevent falls. OUTDOORS  Repair cracks and edges of walkways and driveways.  Remove high doorway thresholds.  Trim shrubbery on the main path into your home.  Have good outside lighting.  Clear walkways of tools, rocks, debris, and clutter.  Check that handrails are not broken and are securely fastened. Both sides of steps should have handrails.  Have leaves, snow, and ice cleared regularly.  Use sand or salt on walkways during winter months.  In the garage, clean up grease or oil spills. BATHROOM  Install night lights.  Install grab bars by the toilet and in the tub and shower.  Use non-skid mats or decals in the tub or shower.  Place a plastic non-slip stool in the shower to sit on, if needed.  Keep floors dry and clean up all water on the floor immediately.  Remove soap buildup in the tub or shower on a regular basis.  Secure bath mats with non-slip, double-sided rug tape.  Remove throw rugs and tripping hazards from the floors. BEDROOMS  Install night lights.  Make sure a bedside light is easy to reach.  Do not use oversized bedding.  Keep a telephone by your bedside.  Have a firm chair with side arms to use for  getting dressed.  Remove throw rugs and tripping hazards from the floor. KITCHEN  Keep handles on pots and pans turned toward the center of the stove. Use back burners when possible.  Clean up spills quickly and allow time for drying.  Avoid walking on wet floors.  Avoid hot utensils and knives.  Position shelves so they are not too high or low.  Place commonly used objects within easy reach.  If necessary, use a sturdy step stool with a grab bar when reaching.  Keep electrical cables out of the way.  Do not use floor polish or wax that makes floors slippery. If you must use wax, use non-skid floor wax.  Remove throw rugs and tripping hazards from the floor. STAIRWAYS  Never leave objects on stairs.  Place handrails on both sides of stairways and use them. Fix any loose handrails. Make sure handrails on both sides of the stairways are as long as the stairs.  Check carpeting to make sure it is firmly attached along stairs. Make repairs to worn or loose carpet promptly.  Avoid placing throw rugs at the top or bottom of stairways, or properly secure the rug with carpet tape to prevent slippage. Get rid of throw rugs, if possible.  Have an electrician put in a light switch at the top and bottom of the stairs. OTHER FALL PREVENTION TIPS  Wear low-heel or rubber-soled shoes that are supportive and fit well. Wear closed toe  shoes.  When using a stepladder, make sure it is fully opened and both spreaders are firmly locked. Do not climb a closed stepladder.  Add color or contrast paint or tape to grab bars and handrails in your home. Place contrasting color strips on first and last steps.  Learn and use mobility aids as needed. Install an electrical emergency response system.  Turn on lights to avoid dark areas. Replace light bulbs that burn out immediately. Get light switches that glow.  Arrange furniture to create clear pathways. Keep furniture in the same place.  Firmly  attach carpet with non-skid or double-sided tape.  Eliminate uneven floor surfaces.  Select a carpet pattern that does not visually hide the edge of steps.  Be aware of all pets. OTHER HOME SAFETY TIPS  Set the water temperature for 120 F (48.8 C).  Keep emergency numbers on or near the telephone.  Keep smoke detectors on every level of the home and near sleeping areas. Document Released: 02/10/2002 Document Revised: 08/22/2011 Document Reviewed: 05/12/2011 [facility] Patient Information 2014 [place], [place].   Preventive Care for Adults, Female A healthy lifestyle and preventive care can promote health and wellness. Preventive health guidelines for women include the following key practices.  A routine yearly physical is a good way to check with your health care provider about your health and preventive screening. It is a chance to share any concerns and updates on your health and to receive a thorough exam.  Visit your dentist for a routine exam and preventive care every 6 months. Brush your teeth twice a day and floss once a day. Good oral hygiene prevents tooth decay and gum disease.  The frequency of eye exams is based on your age, health, family medical history, use of contact lenses, and other factors. Follow your health care provider's recommendations for frequency of eye exams.  Eat a healthy diet. Foods like vegetables, fruits, whole grains, low-fat dairy products, and lean protein foods contain the nutrients you need without too many calories. Decrease your intake of foods high in solid fats, added sugars, and salt. Eat the right amount of calories for you.Get information about a proper diet from your health care provider, if necessary.  Regular physical exercise is one of the most important things you can do for your health. Most adults should get at least 150 minutes of moderate-intensity exercise (any activity that increases your heart rate and causes you to sweat) each  week. In addition, most adults need muscle-strengthening exercises on 2 or more days a week.  Maintain a healthy weight. The body mass index (BMI) is a screening tool to identify possible weight problems. It provides an estimate of body fat based on height and weight. Your health care provider can find your BMI, and can help you achieve or maintain a healthy weight.For adults 20 years and older:  A BMI below 18.5 is considered underweight.  A BMI of 18.5 to 24.9 is normal.  A BMI of 25 to 29.9 is considered overweight.  A BMI of 30 and above is considered obese.  Maintain normal blood lipids and cholesterol levels by exercising and minimizing your intake of saturated fat. Eat a balanced diet with plenty of fruit and vegetables. Blood tests for lipids and cholesterol should begin at age 23 and be repeated every 5 years. If your lipid or cholesterol levels are high, you are over 50, or you are at high risk for heart disease, you may need your cholesterol levels checked more  frequently.Ongoing high lipid and cholesterol levels should be treated with medicines if diet and exercise are not working.  If you smoke, find out from your health care provider how to quit. If you do not use tobacco, do not start.  Lung cancer screening is recommended for adults aged 26 80 years who are at high risk for developing lung cancer because of a history of smoking. A yearly low-dose CT scan of the lungs is recommended for people who have at least a 30-pack-year history of smoking and are a current smoker or have quit within the past 15 years. A pack year of smoking is smoking an average of 1 pack of cigarettes a day for 1 year (for example: 1 pack a day for 30 years or 2 packs a day for 15 years). Yearly screening should continue until the smoker has stopped smoking for at least 15 years. Yearly screening should be stopped for people who develop a health problem that would prevent them from having lung cancer  treatment.  High blood pressure causes heart disease and increases the risk of stroke. Your blood pressure should be checked at least every 1 to 2 years. Ongoing high blood pressure should be treated with medicines if weight loss and exercise do not work.  If you are 11 74 years old, ask your health care provider if you should take aspirin to prevent strokes.  Diabetes screening involves taking a blood sample to check your fasting blood sugar level. This should be done once every 3 years, after age 27, if you are within normal weight and without risk factors for diabetes. Testing should be considered at a younger age or be carried out more frequently if you are overweight and have at least 1 risk factor for diabetes.  Breast cancer screening is essential preventive care for women. You should practice "breast self-awareness." This means understanding the normal appearance and feel of your breasts and may include breast self-examination. Any changes detected, no matter how small, should be reported to a health care provider. Women in their 37s and 30s should have a clinical breast exam (CBE) by a health care provider as part of a regular health exam every 1 to 3 years. After age 15, women should have a CBE every year. Starting at age 78, women should consider having a mammogram (breast X-ray test) every year. Women who have a family history of breast cancer should talk to their health care provider about genetic screening. Women at a high risk of breast cancer should talk to their health care providers about having an MRI and a mammogram every year.  The Pap test is a screening test for cervical cancer. A Pap test can show cell changes on the cervix that might become cervical cancer if left untreated. A Pap test is a procedure in which cells are obtained and examined from the lower end of the uterus (cervix).  Women should have a Pap test starting at age 29.  Some women have medical problems that  increase the chance of getting cervical cancer. Talk to your health care provider about these problems. It is especially important to talk to your health care provider if a new problem develops soon after your last Pap test. In these cases, your health care provider may recommend more frequent screening and Pap tests.  The above recommendations are the same for women who have or have not gotten the vaccine for human papillomavirus (HPV).  If you had a hysterectomy for a problem  that was not cancer or a condition that could lead to cancer, then you no longer need Pap tests. Even if you no longer need a Pap test, a regular exam is a good idea to make sure no other problems are starting.  If you are between ages 34 and 2 years, and you have had normal Pap tests going back 10 years, you no longer need Pap tests. Even if you no longer need a Pap test, a regular exam is a good idea to make sure no other problems are starting.  If you have had past treatment for cervical cancer or a condition that could lead to cancer, you need Pap tests and screening for cancer for at least 20 years after your treatment.  If Pap tests have been discontinued, risk factors (such as a new sexual partner) need to be reassessed to determine if screening should be resumed.  The HPV test is an additional test that may be used for cervical cancer screening. The HPV test looks for the virus that can cause the cell changes on the cervix. The cells collected during the Pap test can be tested for HPV. The HPV test could be used to screen women aged 30 years and older, and should be used in women of any age who have unclear Pap test results. After the age of 84, women should have HPV testing at the same frequency as a Pap test.  Colorectal cancer can be detected and often prevented. Most routine colorectal cancer screening begins at the age of 72 years and continues through age 33 years. However, your health care provider may recommend  screening at an earlier age if you have risk factors for colon cancer. On a yearly basis, your health care provider may provide home test kits to check for hidden blood in the stool. Use of a small camera at the end of a tube, to directly examine the colon (sigmoidoscopy or colonoscopy), can detect the earliest forms of colorectal cancer. Talk to your health care provider about this at age 76, when routine screening begins. Direct exam of the colon should be repeated every 5 10 years through age 59 years, unless early forms of pre-cancerous polyps or small growths are found.  People who are at an increased risk for hepatitis B should be screened for this virus. You are considered at high risk for hepatitis B if:  You were born in a country where hepatitis B occurs often. Talk with your health care provider about which countries are considered high risk.  Your parents were born in a high-risk country and you have not received a shot to protect against hepatitis B (hepatitis B vaccine).  You have HIV or AIDS.  You use needles to inject street drugs.  You live with, or have sex with, someone who has Hepatitis B.  You get hemodialysis treatment.  You take certain medicines for conditions like cancer, organ transplantation, and autoimmune conditions.  Hepatitis C blood testing is recommended for all people born from [birth year] through 1966 and any individual with known risks for hepatitis C.  Practice safe sex. Use condoms and avoid high-risk sexual practices to reduce the spread of sexually transmitted infections (STIs). STIs include gonorrhea, chlamydia, syphilis, trichomonas, herpes, HPV, and human immunodeficiency virus (HIV). Herpes, HIV, and HPV are viral illnesses that have no cure. They can result in disability, cancer, and death. Sexually active women aged 81 years and younger should be checked for chlamydia. Older women with new or multiple  partners should also be tested for chlamydia. Testing  for other STIs is recommended if you are sexually active and at increased risk.  Osteoporosis is a disease in which the bones lose minerals and strength with aging. This can result in serious bone fractures or breaks. The risk of osteoporosis can be identified using a bone density scan. Women ages 50 years and over and women at risk for fractures or osteoporosis should discuss screening with their health care providers. Ask your health care provider whether you should take a calcium supplement or vitamin D to reduce the rate of osteoporosis.  Menopause can be associated with physical symptoms and risks. Hormone replacement therapy is available to decrease symptoms and risks. You should talk to your health care provider about whether hormone replacement therapy is right for you.  Use sunscreen. Apply sunscreen liberally and repeatedly throughout the day. You should seek shade when your shadow is shorter than you. Protect yourself by wearing long sleeves, pants, a wide-brimmed hat, and sunglasses year round, whenever you are outdoors.  Once a month, do a whole body skin exam, using a mirror to look at the skin on your back. Tell your health care provider of new moles, moles that have irregular borders, moles that are larger than a pencil eraser, or moles that have changed in shape or color.  Stay current with required vaccines (immunizations).  Influenza vaccine. All adults should be immunized every year.  Tetanus, diphtheria, and acellular pertussis (Td, Tdap) vaccine. Pregnant women should receive 1 dose of Tdap vaccine during each pregnancy. The dose should be obtained regardless of the length of time since the last dose. Immunization is preferred during the 27th 36th week of gestation. An adult who has not previously received Tdap or who does not know her vaccine status should receive 1 dose of Tdap. This initial dose should be followed by tetanus and diphtheria toxoids (Td) booster doses every 10  years. Adults with an unknown or incomplete history of completing a 3-dose immunization series with Td-containing vaccines should begin or complete a primary immunization series including a Tdap dose. Adults should receive a Td booster every 10 years.  Zoster vaccine. One dose is recommended for adults aged 23 years or older unless certain conditions are present.  Pneumococcal 13-valent conjugate (PCV13) vaccine. When indicated, a person who is uncertain of her immunization history and has no record of immunization should receive the PCV13 vaccine. An adult aged 39 years or older who has certain medical conditions and has not been previously immunized should receive 1 dose of PCV13 vaccine. This PCV13 should be followed with a dose of pneumococcal polysaccharide (PPSV23) vaccine. The PPSV23 vaccine dose should be obtained at least 8 weeks after the dose of PCV13 vaccine. An adult aged 28 years or older who has certain medical conditions and previously received 1 or more doses of PPSV23 vaccine should receive 1 dose of PCV13. The PCV13 vaccine dose should be obtained 1 or more years after the last PPSV23 vaccine dose.  Ages 59 years and over  Blood pressure check. Every Visit  Lipid and cholesterol check. At least yearly  Lung cancer screening. / Every year if you are aged 55 80 years and have a 30-pack-year history of smoking and currently smoke or have quit within the past 15 years. Yearly screening is stopped once you have quit smoking for at least 15 years or develop a health problem that would prevent you from having lung cancer treatment.  Clinical breast  exam. Every year after age 87 years.  Mammogram.Every year beginning at age 65 years and continuing for as long as you are in good health. Consult with your health care provider.  Pap test.Every 3 years starting at age 55 years through age 47 or 60 years with 3 consecutive normal Pap tests. Testing can be stopped between 65 and 70 years with  3 consecutive normal Pap tests and no abnormal Pap or HPV tests in the past 10 years.  Fecal occult blood test (FOBT) of stool. / Every year beginning at age 29 years and continuing until age 72 years. You may not need to do this test if you get a colonoscopy every 10 years.  Flexible sigmoidoscopy or colonoscopy.** / Every 5 years for a flexible sigmoidoscopy or every 10 years for a colonoscopy beginning at age 59 years and continuing until age 2 years.  Hepatitis C blood test.For all people born from [birth year] through 1966 and any individual with known risks for hepatitis C.  Osteoporosis screening. A one-time screening for women ages 1 years and over and women at risk for fractures or osteoporosis.  Skin self-exam. / Monthly.  Influenza vaccine. / Every year.  Tetanus, diphtheria, and acellular pertussis (Tdap/Td) vaccine.** / 1 dose of Td every 10 years.

## 2013-03-27 ENCOUNTER — Other Ambulatory Visit (INDEPENDENT_AMBULATORY_CARE_PROVIDER_SITE_OTHER): Payer: Medicare Other

## 2013-03-27 ENCOUNTER — Encounter: Payer: Self-pay | Admitting: *Deleted

## 2013-03-27 ENCOUNTER — Telehealth: Payer: Self-pay | Admitting: Family Medicine

## 2013-03-27 DIAGNOSIS — R5383 Other fatigue: Secondary | ICD-10-CM

## 2013-03-27 DIAGNOSIS — I10 Essential (primary) hypertension: Secondary | ICD-10-CM

## 2013-03-27 DIAGNOSIS — R5381 Other malaise: Secondary | ICD-10-CM

## 2013-03-27 LAB — POCT CBC
GRANULOCYTE PERCENT: 60.4 % (ref 37–80)
HCT, POC: 42.1 % (ref 37.7–47.9)
Hemoglobin: 13.7 g/dL (ref 12.2–16.2)
LYMPH, POC: 1.7 (ref 0.6–3.4)
MCH, POC: 27.5 pg (ref 27–31.2)
MCHC: 32.5 g/dL (ref 31.8–35.4)
MCV: 84.7 fL (ref 80–97)
MPV: 8 fL (ref 0–99.8)
POC Granulocyte: 3.1 (ref 2–6.9)
POC LYMPH PERCENT: 33.5 %L (ref 10–50)
Platelet Count, POC: 252 10*3/uL (ref 142–424)
RBC: 5 M/uL (ref 4.04–5.48)
RDW, POC: 14.4 %
WBC: 5.2 10*3/uL (ref 4.6–10.2)

## 2013-03-27 MED ORDER — METRONIDAZOLE 0.75 % VA GEL: 1.0000 | Freq: Two times a day (BID) | VAGINAL | Status: DC

## 2013-03-27 NOTE — Progress Notes (Signed)
Pt came in for labs only 

## 2013-03-27 NOTE — Telephone Encounter (Signed)
Message copied by Chipper Herb on Thu Mar 27, 2013 12:55 PM ------      Message from: Priscille Heidelberg      Created: Thu Mar 27, 2013 11:19 AM       Pt is having vaginal itching. [facility] ------

## 2013-03-27 NOTE — Progress Notes (Signed)
Quick Note:  Copy of labs sent to patient ______ 

## 2013-04-02 ENCOUNTER — Encounter: Payer: Self-pay | Admitting: Internal Medicine

## 2013-04-16 ENCOUNTER — Other Ambulatory Visit: Payer: Medicare Other | Admitting: Obstetrics and Gynecology

## 2013-04-23 ENCOUNTER — Ambulatory Visit (INDEPENDENT_AMBULATORY_CARE_PROVIDER_SITE_OTHER): Payer: Medicare Other | Admitting: Obstetrics and Gynecology

## 2013-04-23 ENCOUNTER — Other Ambulatory Visit (HOSPITAL_COMMUNITY)
Admission: RE | Admit: 2013-04-23 | Discharge: 2013-04-23 | Disposition: A | Discharge status: Home / Self Care | Payer: Medicare Other | Source: Ambulatory Visit | Attending: Obstetrics and Gynecology | Admitting: Obstetrics and Gynecology

## 2013-04-23 ENCOUNTER — Encounter: Payer: Self-pay | Admitting: Obstetrics and Gynecology

## 2013-04-23 VITALS — BP 128/92 | Ht 62.0 in | Wt 138.0 lb

## 2013-04-23 DIAGNOSIS — Z124 Encounter for screening for malignant neoplasm of cervix: Secondary | ICD-10-CM | POA: Insufficient documentation

## 2013-04-23 DIAGNOSIS — Z0000 Encounter for general adult medical examination without abnormal findings: Secondary | ICD-10-CM

## 2013-04-23 DIAGNOSIS — Z1151 Encounter for screening for human papillomavirus (HPV): Secondary | ICD-10-CM | POA: Diagnosis not present

## 2013-04-23 NOTE — Progress Notes (Signed)
This chart was scribed by Jenne Campus, Medical Scribe, for Dr. Mallory Shirk on 04/23/13 at 1:58 PM. This chart was reviewed by Dr. Mallory Shirk and is accurate.  Assessment:  Annual Gyn Exam   Plan:  1. pap smear done, next pap due 64yr  2. Return 4 wk for vulvar bx. 3    Annual mammogram advised Subjective:  Tammy [name] is a 73 y.o. female No obstetric history on file. who presents for annual exam. No LMP recorded. Patient is postmenopausal. The patient has complaints today of vaginal sores. Pt states that she had a bladder infection, was prescribed an antibiotic and then developed a yeast infection. She was given Metrogel. Still wearing pads for urinary incontinence. Has f/u appt next week. Has not been sexually active in 20 years. Reports that she is being followed by the [facility] for 3 positive guaiac tests. Plans to have colonoscopy scheduled in the next 2 months.   The following portions of the patient's history were reviewed and updated as appropriate: allergies, current medications, past family history, past medical history, past social history, past surgical history and problem list.  Review of Systems Constitutional: negative Gastrointestinal: negative Genitourinary: Positive for vaginal sores, urinary incontinence   Objective:  There were no vitals taken for this visit.   BMI: There is no weight on file to calculate BMI.  Chaperone present for exam. Exam performed with pt's permission without any discomfort or complications. General Appearance: Alert, appropriate appearance for age. No acute distress HEENT: Grossly normal Neck / Thyroid:  Cardiovascular: RRR; normal S1, S2, no murmur Lungs: CTA bilaterally Back: No CVAT Gastrointestinal: Soft, non-tender, no masses or organomegaly Pelvic Exam: Vaginal: atrophic vulvar distrophy Cervix: normal appearance and thin prep PAP obtained Adnexa: normal bimanual exam Uterus: normal single, nontender Rectal: good sphincter tone and  guaiac negative EFG:  Loss of labia minora, with white leukoplakia, over both sides of introitus, with eccyhymosis on right side. Vagina atrophic w/o lesions Pap done Lymphatic Exam: Non-palpable nodes in neck, clavicular, axillary, or inguinal regions  Skin: no rash or abnormalities Neurologic: Normal gait and speech, no tremor  Psychiatric: Alert and oriented, appropriate affect.  Urinalysis:Not done Assess:vulvar dystrophy , bilateral.               Negative hemoccult here Plan: Biopsy discussed with pt and she agreed. Vulvar biopsy x2 scheduled in 4 weeks  Mallory Shirk. MD Pgr [phone] 2:35 PM

## 2013-04-24 DIAGNOSIS — E213 Hyperparathyroidism, unspecified: Secondary | ICD-10-CM | POA: Diagnosis not present

## 2013-04-24 DIAGNOSIS — M810 Age-related osteoporosis without current pathological fracture: Secondary | ICD-10-CM | POA: Diagnosis not present

## 2013-04-29 ENCOUNTER — Encounter: Payer: Self-pay | Admitting: Internal Medicine

## 2013-04-30 ENCOUNTER — Encounter: Payer: Self-pay | Admitting: Internal Medicine

## 2013-04-30 ENCOUNTER — Ambulatory Visit (INDEPENDENT_AMBULATORY_CARE_PROVIDER_SITE_OTHER): Payer: Medicare Other | Admitting: Internal Medicine

## 2013-04-30 VITALS — BP 154/106 | HR 80 | Ht 61.5 in | Wt 140.2 lb

## 2013-04-30 DIAGNOSIS — K219 Gastro-esophageal reflux disease without esophagitis: Secondary | ICD-10-CM | POA: Diagnosis not present

## 2013-04-30 DIAGNOSIS — E213 Hyperparathyroidism, unspecified: Secondary | ICD-10-CM | POA: Diagnosis not present

## 2013-04-30 DIAGNOSIS — R195 Other fecal abnormalities: Secondary | ICD-10-CM

## 2013-04-30 MED ORDER — MOVIPREP 100 G PO SOLR: ORAL | Status: DC

## 2013-04-30 NOTE — Patient Instructions (Signed)
You have been scheduled for a colonoscopy with propofol. Please follow written instructions given to you at your visit today.  Please pick up your prep kit at the pharmacy within the next 1-3 days. If you use inhalers (even only as needed), please bring them with you on the day of your procedure. Your physician has requested that you go to [email or web address] and enter the access code given to you at your visit today. This web site gives a general overview about your procedure. However, you should still follow specific instructions given to you by our office regarding your preparation for the procedure.                                                We are excited to introduce MyChart, a new best-in-class service that provides you online access to important information in your electronic medical record. We want to make it easier for you to view your health information - all in one secure location - when and where you need it. We expect MyChart will enhance the quality of care and service we provide.  When you register for MyChart, you can:    View your test results.    Request appointments and receive appointment reminders via email.    Request medication renewals.    View your medical history, allergies, medications and immunizations.    Communicate with your physician's office through a password-protected site.    Conveniently print information such as your medication lists.  To find out if MyChart is right for you, please talk to a member of our clinical staff today. We will gladly answer your questions about this free health and wellness tool.  If you are age 18 or older and want a member of your family to have access to your record, you must provide written consent by completing a proxy form available at our office. Please speak to our clinical staff about guidelines regarding accounts for patients younger than age 18.  As you activate your MyChart account and need any technical  assistance, please call the MyChart technical support line at [phone] ([phone]) or email your question to [email or web address]. If you email your question(s), please include your name, a return phone number and the best time to reach you.  If you have non-urgent health-related questions, you can send a message to our office through MyChart at [email or web address]. If you have a medical emergency, call 911.  Thank you for using MyChart as your new health and wellness resource!   MyChart licensed from [employer],  1999-2010. Patents Pending.   

## 2013-04-30 NOTE — Progress Notes (Signed)
Patient ID: Tammy [name], female   DOB: Feb 22, 1941, 73 y.o.   MRN: [id number] HPI: Tammy [name] is a 73 year old female with a past medical history of hypertension, hyperlipidemia, osteoporosis, GERD, aortic aneurysm, history of PUD who seen in consultation at request of Dr. Laurance [name] for consideration of colonoscopy after heme positive stool. She is here alone today. She reports overall she is feeling well. She has had colonoscopies in the past which were performed by general surgeon in [place], [place]. He feels like her last colonoscopy was 5 or 6 years ago. She does not recall a history of polyps. She is aware of her FOBT positive result and is willing to undergo repeat colonoscopy. She reports her bowel movements have been regular without diarrhea or constipation. Her stools have been slightly darker over the last 3 weeks but she denies black or tarry stool. She has not seen red blood in her stool. She does feel that time she has issues with hemorrhoids which is primarily itching but she does not have bleeding. She uses hydrocortisone cream which relieves the itching. She denies abdominal pain. She was having some upper abdominal pain within the past several months but this has resolved completely. She feels this might have been secondary to an antibiotic that she was receiving for a UTI. She reports a good appetite without nausea or vomiting. She denies significant heartburn, dysphagia or odynophagia. She does take omeprazole 20 mg twice daily for GERD.  Past Medical History  Diagnosis Date  . AORTIC ANEURYSM 08/06/2008    Qualifier: Diagnosis of  By: Burnett Kanaris    . Hypertension   . GERD (gastroesophageal reflux disease)   . Hyperlipidemia   . Osteoporosis   . Allergy     allergic rhinitis  . Melanoma in situ of neck 1990's  . Scoliosis     CHILDHOOD  . Early cataracts, bilateral   . Arthritis   . Multiple gastric ulcers   . Lordosis     sway back    Past Surgical History  Procedure  Laterality Date  . Aneurysm surgery    . Idet surgery      for broken back bones and sway back    Current Outpatient Prescriptions  Medication Sig Dispense Refill  . amLODipine-valsartan (EXFORGE) 5-320 MG per tablet Take 1 tablet by mouth daily.      . metroNIDAZOLE (METROGEL VAGINAL) 0.75 % vaginal gel Place 1 Applicatorful vaginally 2 (two) times daily.  70 g  0  . Omega-3 Fatty Acids (FISH OIL) 1000 MG CAPS Take 2 capsules by mouth daily.      Marland Kitchen MOVIPREP 100 G SOLR Use per prep instruction  1 kit  0  . omeprazole (PRILOSEC) 20 MG capsule Take 20 mg by mouth 2 (two) times daily.       No current facility-administered medications for this visit.    Allergies  Allergen Reactions  . Ivp Dye [Iodinated Diagnostic Agents] Hives  . Morphine And Related Other (See Comments)  . Aspirin     REACTION: Reaction not known  . Flexeril [Cyclobenzaprine] Other (See Comments)    Talking and walking in sleep  . Iodine     REACTION: Reaction not known  . Nsaids     REACTION: Reaction not known  . Boniva [Ibandronic Acid] Other (See Comments)  . Fosamax [Alendronate Sodium] Other (See Comments)  . Livalo [Pitavastatin] Other (See Comments)    myalgias  . Simvastatin Other (See Comments)    myalgias  Family History  Problem Relation Age of Onset  . Stroke Father   . Arthritis Father   . Hyperlipidemia Mother   . Stroke Mother   . Heart attack Brother   . COPD Brother   . Heart attack Brother   . Lung cancer Brother   . Kidney cancer Brother   . Colon polyps Brother   . Other Brother     asbestos poisoning    History  Substance Use Topics  . Smoking status: Former Smoker    Types: Cigarettes    Quit date: 03/06/1978  . Smokeless tobacco: Never Used  . Alcohol Use: No    ROS: As per history of present illness, otherwise negative  BP 154/106  Pulse 80  Ht 5' 1.5" (1.562 m)  Wt 140 lb 4 oz (63.617 kg)  BMI 26.07 kg/m2 Constitutional: Well-developed and  well-nourished. No distress. HEENT: Normocephalic and atraumatic. Oropharynx is clear and moist. No oropharyngeal exudate. Conjunctivae are normal.  No scleral icterus. Neck: Neck supple. Trachea midline. Cardiovascular: Normal rate, regular rhythm and intact distal pulses.  Pulmonary/chest: Effort normal and breath sounds normal. No wheezing, rales or rhonchi. Abdominal: Soft, nontender, nondistended. Bowel sounds active throughout.  Extremities: no clubbing, cyanosis, or edema Lymphadenopathy: No cervical adenopathy noted. Neurological: Alert and oriented to person place and time. Skin: Skin is warm and dry. No rashes noted. Psychiatric: Normal mood and affect. Behavior is normal.  RELEVANT LABS AND IMAGING: CBC    Component Value Date/Time   WBC 5.2 03/27/2013 1230   WBC CANCELED 03/03/2013 1225   WBC 10.7* 09/26/2008 0555   RBC 5.0 03/27/2013 1230   RBC CANCELED 03/03/2013 1225   RBC 3.97 09/26/2008 0555   HGB 13.7 03/27/2013 1230   HGB CANCELED 03/03/2013 1225   HCT 42.1 03/27/2013 1230   HCT CANCELED 03/03/2013 1225   PLT CANCELED 03/03/2013 1225   MCV 84.7 03/27/2013 1230   MCV 85.5 09/26/2008 0555   MCH 27.5 03/27/2013 1230   MCHC 32.5 03/27/2013 1230   MCHC 34.1 09/26/2008 0555   RDW 15.0 09/26/2008 0555   LYMPHSABS CANCELED 03/03/2013 1225   EOSABS CANCELED 03/03/2013 1225   BASOSABS CANCELED 03/03/2013 1225    CMP     Component Value Date/Time   NA 143 03/03/2013 1225   NA 141 09/02/2012 1245   K 4.3 03/03/2013 1225   CL 102 03/03/2013 1225   CO2 22 03/03/2013 1225   GLUCOSE 82 03/03/2013 1225   GLUCOSE 75 09/02/2012 1245   BUN 9 03/03/2013 1225   BUN 12 09/02/2012 1245   CREATININE 0.80 03/03/2013 1225   CREATININE 0.77 09/02/2012 1245   CALCIUM 10.4* 03/03/2013 1225   PROT 6.6 02/12/2013 0852   PROT 6.5 09/02/2012 1245   ALBUMIN 4.5 09/02/2012 1245   AST 19 02/12/2013 0852   ALT 14 02/12/2013 0852   ALKPHOS 81 02/12/2013 0852   BILITOT 0.5 02/12/2013 0852    GFRNONAA 73 03/03/2013 1225   GFRAA 85 03/03/2013 1225   FOBT - positive  ASSESSMENT/PLAN:  73 year old female with a past medical history of hypertension, hyperlipidemia, osteoporosis, GERD, aortic aneurysm, history of PUD who seen in consultation at request of Dr. Laurance [name] for consideration of colonoscopy after heme positive stool.  1.  CRC screening/FOBT + stool -- I have recommended repeat colonoscopy given her heme-positive stool. We discussed this test today including the risks and benefits and she is agreeable to proceed.  2.  GERD -- well-controlled was no upper GI alarm  symptoms on omeprazole. She will continue with omeprazole at her current dose.

## 2013-05-06 ENCOUNTER — Encounter: Payer: Self-pay | Admitting: Obstetrics and Gynecology

## 2013-05-06 ENCOUNTER — Ambulatory Visit (INDEPENDENT_AMBULATORY_CARE_PROVIDER_SITE_OTHER): Payer: Medicare Other | Admitting: Obstetrics and Gynecology

## 2013-05-06 VITALS — BP 150/88 | Ht 61.0 in | Wt 139.2 lb

## 2013-05-06 DIAGNOSIS — N904 Leukoplakia of vulva: Secondary | ICD-10-CM

## 2013-05-06 DIAGNOSIS — L940 Localized scleroderma [morphea]: Secondary | ICD-10-CM | POA: Diagnosis not present

## 2013-05-06 NOTE — Patient Instructions (Signed)
Topical neosporin

## 2013-05-06 NOTE — Progress Notes (Signed)
Patient ID: Tammy [name], female   DOB: 1940-12-02, 73 y.o.   MRN: [id number]  Chief Complaint  Patient presents with  . vulvar biopsy    consent signed    HPI Tammy [name] is a 73 y.o. female.   HPI Indication: erythema and white lesion of the vulva Symptoms:   pruritic and not painful, burning and none  Location:  labia majora on the left  Past Medical History  Diagnosis Date  . AORTIC ANEURYSM 08/06/2008    Qualifier: Diagnosis of  By: Burnett Kanaris    . Hypertension   . GERD (gastroesophageal reflux disease)   . Hyperlipidemia   . Osteoporosis   . Allergy     allergic rhinitis  . Melanoma in situ of neck 1990's  . Scoliosis     CHILDHOOD  . Early cataracts, bilateral   . Arthritis   . Multiple gastric ulcers   . Lordosis     sway back    Past Surgical History  Procedure Laterality Date  . Aneurysm surgery    . Idet surgery      for broken back bones and sway back    Family History  Problem Relation Age of Onset  . Stroke Father   . Arthritis Father   . Hyperlipidemia Mother   . Stroke Mother   . Heart attack Brother   . COPD Brother   . Heart attack Brother   . Lung cancer Brother   . Kidney cancer Brother   . Colon polyps Brother   . Other Brother     asbestos poisoning    Social History History  Substance Use Topics  . Smoking status: Former Smoker    Types: Cigarettes    Quit date: 03/06/1978  . Smokeless tobacco: Never Used  . Alcohol Use: No    Allergies  Allergen Reactions  . Ivp Dye [Iodinated Diagnostic Agents] Hives  . Morphine And Related Other (See Comments)  . Aspirin     REACTION: Reaction not known  . Flexeril [Cyclobenzaprine] Other (See Comments)    Talking and walking in sleep  . Iodine     REACTION: Reaction not known  . Nsaids     REACTION: Reaction not known  . Boniva [Ibandronic Acid] Other (See Comments)  . Fosamax [Alendronate Sodium] Other (See Comments)  . Livalo [Pitavastatin] Other (See Comments)    myalgias   . Simvastatin Other (See Comments)    myalgias    Current Outpatient Prescriptions  Medication Sig Dispense Refill  . amLODipine-valsartan (EXFORGE) 5-320 MG per tablet Take 1 tablet by mouth daily.      . Omega-3 Fatty Acids (FISH OIL) 1000 MG CAPS Take 2 capsules by mouth daily.      Marland Kitchen omeprazole (PRILOSEC) 20 MG capsule Take 20 mg by mouth 2 (two) times daily.      . metroNIDAZOLE (METROGEL VAGINAL) 0.75 % vaginal gel Place 1 Applicatorful vaginally 2 (two) times daily.  70 g  0  . MOVIPREP 100 G SOLR Use per prep instruction  1 kit  0   No current facility-administered medications for this visit.    Review of Systems Review of Systems  Blood pressure 150/88, height 5' 1"  (1.549 m), weight 139 lb 3.2 oz (63.141 kg).  Physical Exam Physical Exam  Data Reviewed   Assessment    Prepping with Betadine Local anesthesia with 1% Buffered Lidocaine 1 cm  mm punch biopsy performed per protocol, Silver Nitrate applied:  No: suture x 3 placed  Well tolerated  Specimen appropriately identified and sent to pathology  3 stitches closure.  Plan    Follow-up:  2 weeks      Nadira Single V 05/06/2013, 4:01 PM

## 2013-05-07 ENCOUNTER — Other Ambulatory Visit: Payer: Self-pay | Admitting: Obstetrics and Gynecology

## 2013-05-07 DIAGNOSIS — L940 Localized scleroderma [morphea]: Secondary | ICD-10-CM | POA: Diagnosis not present

## 2013-05-08 ENCOUNTER — Other Ambulatory Visit (HOSPITAL_COMMUNITY): Payer: Self-pay

## 2013-05-09 ENCOUNTER — Encounter (HOSPITAL_COMMUNITY)
Admission: RE | Admit: 2013-05-09 | Discharge: 2013-05-09 | Disposition: A | Discharge status: Home / Self Care | Payer: Medicare Other | Source: Ambulatory Visit | Attending: Endocrinology | Admitting: Endocrinology

## 2013-05-09 DIAGNOSIS — M810 Age-related osteoporosis without current pathological fracture: Secondary | ICD-10-CM | POA: Insufficient documentation

## 2013-05-09 MED ORDER — ZOLEDRONIC ACID 5 MG/100ML IV SOLN
INTRAVENOUS | Status: AC
  Administered 2013-05-09: 5 mg
  Filled 2013-05-09: qty 100

## 2013-05-09 MED ORDER — ZOLEDRONIC ACID 5 MG/100ML IV SOLN: 5.0000 mg | Freq: Once | INTRAVENOUS | Status: DC

## 2013-05-13 ENCOUNTER — Telehealth: Payer: Self-pay | Admitting: Obstetrics and Gynecology

## 2013-05-13 DIAGNOSIS — N904 Leukoplakia of vulva: Secondary | ICD-10-CM

## 2013-05-14 NOTE — Telephone Encounter (Addendum)
Patient inquiring about a cream you told her to use at her last appointment, she states nothing was sent into the pharmacy and did not know if it was something over the counter or a Rx.  Clobetasol called in to pharmacy

## 2013-05-14 NOTE — Telephone Encounter (Signed)
Message copied by Doyne Keel on Wed May 14, 2013  5:15 PM ------      Message from: Jonnie Kind      Created: Tue May 13, 2013 11:21 PM       No evidence of cancer or precancer. Dx of Lichen Sclerosis confirmed. Will continue current tx. Pt to be notified ------

## 2013-05-16 DIAGNOSIS — N904 Leukoplakia of vulva: Secondary | ICD-10-CM | POA: Insufficient documentation

## 2013-05-16 MED ORDER — CLOBETASOL PROPIONATE 0.05 % EX CREA: 1.0000 "application " | TOPICAL_CREAM | Freq: Two times a day (BID) | CUTANEOUS | Status: DC

## 2013-05-16 NOTE — Telephone Encounter (Signed)
Message copied by Doyne Keel on Fri May 16, 2013  9:22 AM ------      Message from: Jonnie Kind      Created: Tue May 13, 2013 11:21 PM       No evidence of cancer or precancer. Dx of Lichen Sclerosis confirmed. Will continue current tx. Pt to be notified ------

## 2013-05-16 NOTE — Telephone Encounter (Signed)
Left message for pt to pick up meds at pharmacy, and followup in 4 wks re exam.

## 2013-05-16 NOTE — Telephone Encounter (Signed)
Pt informed vulvar biopsy showed no cancer or precancer, showed Lichen Sclerosis.  Pt states pharmacy did not have cream Dr. Glo Herring was going to prescribed for the Lichen Sclerosis.

## 2013-05-20 ENCOUNTER — Encounter: Payer: Self-pay | Admitting: Obstetrics and Gynecology

## 2013-05-20 ENCOUNTER — Ambulatory Visit (INDEPENDENT_AMBULATORY_CARE_PROVIDER_SITE_OTHER): Payer: Medicare Other | Admitting: Obstetrics and Gynecology

## 2013-05-20 VITALS — BP 116/80 | Ht 61.0 in | Wt 141.6 lb

## 2013-05-20 DIAGNOSIS — N904 Leukoplakia of vulva: Secondary | ICD-10-CM | POA: Diagnosis not present

## 2013-05-20 NOTE — Progress Notes (Signed)
Patient ID: Tammy [name], female   DOB: 01/09/1941, 73 y.o.   MRN: [id number]   [facility] Visit  Patient name: Tammy [name] MRN [id number]  Date of birth: 1940/03/16  CC & HPI:  Tammy [name] is a 73 y.o. female presenting today for followup vulvar biopsy":path: atrophic vulvar dystophy. Pt is itching less using 'dry vulva " strategies, and 1% hydrocortisone. Using sanitary pads, thick ones  ROS:  Sees urologist next wk.for follow up of cystoscopy.Dr woodlief(?sp)  Recently s/p Boniva tx, even tho pt reports being allergic,    Pertinent History Reviewed:  Medical & Surgical Hx:  Reviewed: Significant for osteoporosis, Medications: Reviewed & Updated - see associated section Social History: Reviewed -  reports that she quit smoking about 35 years ago. Her smoking use included Cigarettes. She smoked 0.00 packs per day. She has never used smokeless tobacco.  Objective Findings:  Vitals: BP 116/80  Ht 5\' 1"  (1.549 m)  Wt 141 lb 9.6 oz (64.229 kg)  BMI 26.77 kg/m2  Physical Examination: Pelvic - VULVA: normal appearing vulva with no masses, tenderness or lesions, sslow healing s/p biopsy felt wnl  , VAGINA: normal appearing vagina with normal color and discharge, no lesions, atrophic   Assessment & Plan:   Atrophic vulvar dystrophy Plan  1% HC Continue dry regimen Reck prn or 1 yr

## 2013-05-22 DIAGNOSIS — R32 Unspecified urinary incontinence: Secondary | ICD-10-CM | POA: Diagnosis not present

## 2013-05-22 DIAGNOSIS — R350 Frequency of micturition: Secondary | ICD-10-CM | POA: Diagnosis not present

## 2013-05-22 DIAGNOSIS — R3915 Urgency of urination: Secondary | ICD-10-CM | POA: Diagnosis not present

## 2013-05-26 ENCOUNTER — Ambulatory Visit (INDEPENDENT_AMBULATORY_CARE_PROVIDER_SITE_OTHER): Payer: Medicare Other | Admitting: Family Medicine

## 2013-05-26 ENCOUNTER — Encounter: Payer: Self-pay | Admitting: Family Medicine

## 2013-05-26 VITALS — BP 129/81 | HR 71 | Temp 97.1°F | Ht 61.0 in | Wt 138.0 lb

## 2013-05-26 DIAGNOSIS — E7800 Pure hypercholesterolemia, unspecified: Secondary | ICD-10-CM | POA: Diagnosis not present

## 2013-05-26 DIAGNOSIS — K219 Gastro-esophageal reflux disease without esophagitis: Secondary | ICD-10-CM

## 2013-05-26 DIAGNOSIS — I10 Essential (primary) hypertension: Secondary | ICD-10-CM

## 2013-05-26 DIAGNOSIS — M810 Age-related osteoporosis without current pathological fracture: Secondary | ICD-10-CM | POA: Diagnosis not present

## 2013-05-26 DIAGNOSIS — E559 Vitamin D deficiency, unspecified: Secondary | ICD-10-CM

## 2013-05-26 NOTE — Patient Instructions (Addendum)
Medicare Annual Wellness Visit  [facility] and the medical providers at [facility] strive to bring you the best medical care.  In doing so we not only want to address your current medical conditions and concerns but also to detect new conditions early and prevent illness, disease and health-related problems.    Medicare offers a yearly Wellness Visit which allows our clinical staff to assess your need for preventative services including immunizations, lifestyle education, counseling to decrease risk of preventable diseases and screening for fall risk and other medical concerns.    This visit is provided free of charge (no copay) for all Medicare recipients. The clinical pharmacists at [facility] have begun to conduct these Wellness Visits which will also include a thorough review of all your medications.    As you primary medical provider recommend that you make an appointment for your Annual Wellness Visit if you have not done so already this year.  You may set up this appointment before you leave today or you may call back ([phone]) and schedule an appointment.  Please make sure when you call that you mention that you are scheduling your Annual Wellness Visit with the clinical pharmacist so that the appointment may be made for the proper length of time.     Continue current medications. Continue good therapeutic lifestyle changes which include good diet and exercise. Fall precautions discussed with patient. If an FOBT was given today- please return it to our front desk. If you are over 40 years old - you may need Prevnar 62 or the adult Pneumonia vaccine.  Be sure and followup on a colonoscopy as planned Return to clinic tomorrow for fasting lab work Continue to walk and exercise and drink plenty of fluids Flonase over-the-counter 1-2 sprays each nostril at bedtime, use saline nose spray during the day

## 2013-05-26 NOTE — Progress Notes (Signed)
Subjective:    Patient ID: Tammy [name], female    DOB: 05-13-1940, 73 y.o.   MRN: [id number]  HPI Pt here for follow up and management of chronic medical problems. The patient is stable. Is important to note that she cannot take any other medications for her hyperlipidemia. Is also important to note that she had a positive FOBT in January and she is scheduled for a colonoscopy next month. She is due to return tomorrow for fasting lab work . It is important to note that she had a recent Pap smear that was abnormal but subsequent biopsies were negative for cancer. She also had some bladder issues which have been resolved through the cystoscopy and some type of dilatation.        Patient Active Problem List   Diagnosis Date Noted  . 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 GERD (gastroesophageal reflux disease) 03/26/2013  . Statin intolerance 03/26/2013  . Oral bisphosphonates causing adverse effect in therapeutic use 03/26/2013  . HYPERCHOLESTEROLEMIA 08/06/2008  . HYPERTENSION 08/06/2008   Outpatient Encounter Prescriptions as of 05/26/2013  Medication Sig  . amLODipine-valsartan (EXFORGE) 5-320 MG per tablet Take 1 tablet by mouth daily.  . metroNIDAZOLE (METROGEL VAGINAL) 0.75 % vaginal gel Place 1 Applicatorful vaginally 2 (two) times daily.  Marland Kitchen MOVIPREP 100 G SOLR Use per prep instruction  . Omega-3 Fatty Acids (FISH OIL) 1000 MG CAPS Take 2 capsules by mouth daily.  Marland Kitchen omeprazole (PRILOSEC) 20 MG capsule Take 20 mg by mouth 2 (two) times daily.    Review of Systems  Constitutional: Negative.   HENT: Negative.   Eyes: Negative.   Respiratory: Negative.   Cardiovascular: Negative.   Gastrointestinal: Negative.   Endocrine: Negative.   Genitourinary: Negative.   Musculoskeletal: Negative.   Skin: Negative.   Allergic/Immunologic: Negative.   Neurological: Negative.   Hematological: Negative.   Psychiatric/Behavioral: Negative.          Objective:   Physical Exam  Nursing note and vitals reviewed. Constitutional: She is oriented to person, place, and time. She appears well-developed and well-nourished. No distress.  HENT:  Head: Normocephalic and atraumatic.  Right Ear: External ear normal.  Left Ear: External ear normal.  Nose: Nose normal.  Mouth/Throat: Oropharynx is clear and moist. No oropharyngeal exudate.  Eyes: Conjunctivae and EOM are normal. Pupils are equal, round, and reactive to light. Right eye exhibits no discharge. Left eye exhibits no discharge. No scleral icterus.  Neck: Normal range of motion. Neck supple. No thyromegaly present.  Cardiovascular: Normal rate, regular rhythm, normal heart sounds and intact distal pulses.   No murmur heard. At 72 per minute  Pulmonary/Chest: Effort normal and breath sounds normal. No respiratory distress. She has no wheezes. She has no rales. She exhibits no tenderness.  Abdominal: Soft. Bowel sounds are normal. She exhibits no mass. There is no tenderness. There is no rebound and no guarding.  Abdominal scar tenderness  Musculoskeletal: Normal range of motion. She exhibits no edema and no tenderness.  Lymphadenopathy:    She has no cervical adenopathy.  Neurological: She is alert and oriented to person, place, and time. She has normal reflexes.  Skin: Skin is warm and dry.  Dry skin  Psychiatric: She has a normal mood and affect. Her behavior is normal. Judgment and thought content normal.  Some anxiety regarding her marital issues   BP 129/81  Pulse 71  Temp(Src) 97.1 F (36.2 C) (Oral)  Ht 5' 1" (1.549 m)  Wt 138 lb (62.596 kg)  BMI 26.09 kg/m2        Assessment & Plan:  1. GERD (gastroesophageal reflux disease) - POCT CBC; Future  2. HYPERCHOLESTEROLEMIA - POCT CBC; Future - NMR, lipoprofile; Future  3. HYPERTENSION - POCT CBC; Future - BMP8+EGFR; Future - Hepatic function panel; Future  4. Osteoporosis, post-menopausal - POCT CBC;  Future - Vit D  25 hydroxy (rtn osteoporosis monitoring); Future  5. Vitamin D deficiency - POCT CBC; Future - Vit D  25 hydroxy (rtn osteoporosis monitoring); Future  Patient Instructions                       Medicare Annual Wellness Visit  [facility] and the medical providers at [facility] strive to bring you the best medical care.  In doing so we not only want to address your current medical conditions and concerns but also to detect new conditions early and prevent illness, disease and health-related problems.    Medicare offers a yearly Wellness Visit which allows our clinical staff to assess your need for preventative services including immunizations, lifestyle education, counseling to decrease risk of preventable diseases and screening for fall risk and other medical concerns.    This visit is provided free of charge (no copay) for all Medicare recipients. The clinical pharmacists at [facility] have begun to conduct these Wellness Visits which will also include a thorough review of all your medications.    As you primary medical provider recommend that you make an appointment for your Annual Wellness Visit if you have not done so already this year.  You may set up this appointment before you leave today or you may call back ([phone]) and schedule an appointment.  Please make sure when you call that you mention that you are scheduling your Annual Wellness Visit with the clinical pharmacist so that the appointment may be made for the proper length of time.     Continue current medications. Continue good therapeutic lifestyle changes which include good diet and exercise. Fall precautions discussed with patient. If an FOBT was given today- please return it to our front desk. If you are over 9 years old - you may need Prevnar 62 or the adult Pneumonia vaccine.  Be sure and followup on a colonoscopy as planned Return to clinic tomorrow  for fasting lab work Continue to walk and exercise and drink plenty of fluids Flonase over-the-counter 1-2 sprays each nostril at bedtime, use saline nose spray during the day    Arrie Senate MD

## 2013-05-27 ENCOUNTER — Other Ambulatory Visit: Payer: Medicare Other

## 2013-05-28 ENCOUNTER — Other Ambulatory Visit (INDEPENDENT_AMBULATORY_CARE_PROVIDER_SITE_OTHER): Payer: Medicare Other

## 2013-05-28 DIAGNOSIS — M810 Age-related osteoporosis without current pathological fracture: Secondary | ICD-10-CM | POA: Diagnosis not present

## 2013-05-28 DIAGNOSIS — E7800 Pure hypercholesterolemia, unspecified: Secondary | ICD-10-CM | POA: Diagnosis not present

## 2013-05-28 DIAGNOSIS — E559 Vitamin D deficiency, unspecified: Secondary | ICD-10-CM | POA: Diagnosis not present

## 2013-05-28 DIAGNOSIS — K219 Gastro-esophageal reflux disease without esophagitis: Secondary | ICD-10-CM

## 2013-05-28 DIAGNOSIS — I10 Essential (primary) hypertension: Secondary | ICD-10-CM

## 2013-05-28 LAB — POCT CBC
GRANULOCYTE PERCENT: 73.3 % (ref 37–80)
HEMATOCRIT: 41.1 % (ref 37.7–47.9)
Hemoglobin: 13.1 g/dL (ref 12.2–16.2)
LYMPH, POC: 1.1 (ref 0.6–3.4)
MCH: 27.2 pg (ref 27–31.2)
MCHC: 32 g/dL (ref 31.8–35.4)
MCV: 85.1 fL (ref 80–97)
MPV: 7.4 fL (ref 0–99.8)
PLATELET COUNT, POC: 278 10*3/uL (ref 142–424)
POC Granulocyte: 3.6 (ref 2–6.9)
POC LYMPH PERCENT: 22 %L (ref 10–50)
RBC: 4.8 M/uL (ref 4.04–5.48)
RDW, POC: 13.8 %
WBC: 4.9 10*3/uL (ref 4.6–10.2)

## 2013-05-28 NOTE — Progress Notes (Signed)
Pt came in for labs only 

## 2013-05-29 ENCOUNTER — Encounter: Payer: Self-pay | Admitting: Internal Medicine

## 2013-05-30 LAB — HEPATIC FUNCTION PANEL
ALK PHOS: 82 IU/L (ref 39–117)
ALT: 11 IU/L (ref 0–32)
AST: 16 IU/L (ref 0–40)
Albumin: 4.2 g/dL (ref 3.5–4.8)
BILIRUBIN DIRECT: 0.12 mg/dL (ref 0.00–0.40)
BILIRUBIN TOTAL: 0.6 mg/dL (ref 0.0–1.2)
Total Protein: 6.4 g/dL (ref 6.0–8.5)

## 2013-05-30 LAB — BMP8+EGFR
BUN/Creatinine Ratio: 16 (ref 11–26)
BUN: 13 mg/dL (ref 8–27)
CHLORIDE: 103 mmol/L (ref 97–108)
CO2: 25 mmol/L (ref 18–29)
Calcium: 9.7 mg/dL (ref 8.7–10.3)
Creatinine, Ser: 0.81 mg/dL (ref 0.57–1.00)
GFR calc Af Amer: 83 mL/min/{1.73_m2} (ref >59)
GFR calc non Af Amer: 72 mL/min/{1.73_m2} (ref >59)
Glucose: 76 mg/dL (ref 65–99)
POTASSIUM: 4.4 mmol/L (ref 3.5–5.2)
SODIUM: 141 mmol/L (ref 134–144)

## 2013-05-30 LAB — NMR, LIPOPROFILE
Cholesterol: 263 mg/dL — ABNORMAL HIGH (ref <200)
HDL Cholesterol by NMR: 46 mg/dL (ref >=40)
HDL Particle Number: 25.5 umol/L — ABNORMAL LOW (ref >=30.5)
LDL Particle Number: 2084 nmol/L — ABNORMAL HIGH (ref <1000)
LDL SIZE: 21.4 nm (ref >20.5)
LDLC SERPL CALC-MCNC: 190 mg/dL — ABNORMAL HIGH (ref <100)
LP-IR SCORE: 32 (ref <=45)
Triglycerides by NMR: 136 mg/dL (ref <150)

## 2013-05-30 LAB — VITAMIN D 25 HYDROXY (VIT D DEFICIENCY, FRACTURES): Vit D, 25-Hydroxy: 15.9 ng/mL — ABNORMAL LOW (ref 30.0–100.0)

## 2013-06-04 ENCOUNTER — Other Ambulatory Visit: Payer: Self-pay | Admitting: *Deleted

## 2013-06-04 ENCOUNTER — Encounter: Payer: Self-pay | Admitting: *Deleted

## 2013-06-04 MED ORDER — VITAMIN D (ERGOCALCIFEROL) 1.25 MG (50000 UNIT) PO CAPS: 50000.0000 [IU] | ORAL_CAPSULE | ORAL | Status: DC

## 2013-06-09 ENCOUNTER — Telehealth: Payer: Self-pay | Admitting: Internal Medicine

## 2013-06-09 NOTE — Telephone Encounter (Signed)
Sent out new instructions to pt. With dates and times highlighted.

## 2013-06-17 ENCOUNTER — Encounter: Payer: Medicare Other | Admitting: Internal Medicine

## 2013-06-23 ENCOUNTER — Telehealth: Payer: Self-pay | Admitting: Internal Medicine

## 2013-06-23 ENCOUNTER — Telehealth: Payer: Self-pay

## 2013-06-23 ENCOUNTER — Encounter: Payer: Medicare Other | Admitting: Internal Medicine

## 2013-06-23 NOTE — Telephone Encounter (Signed)
Message copied by Marlon Pel on Mon Jun 23, 2013 10:43 AM ------      Message from: Jerene Bears      Created: Sun Jun 22, 2013 11:18 PM       Got the below message from Carol Ada and she is canceling procedure for Monday, 06/23/13      Can we check in to see if she plans to reschedule      Thanks      JMP            ----- Message -----         From: Beryle Beams, MD         Sent: 06/22/2013   9:17 AM           To: Jerene Bears, MD            Ulice Dash,             Your patient called and is having problems with SOB, nausea, and vomiting.  She is cancelling her procedure.  Also, she is not certain that she wants to undergo the procedure.       ------

## 2013-06-23 NOTE — Telephone Encounter (Signed)
No answer

## 2013-06-23 NOTE — Telephone Encounter (Signed)
No charge Got message from weekend MD of cancellation

## 2013-06-23 NOTE — Telephone Encounter (Signed)
No answer/machine 

## 2013-06-25 NOTE — Telephone Encounter (Signed)
Phone line busy.

## 2013-06-26 NOTE — Telephone Encounter (Signed)
No answer/phone busy

## 2013-07-08 DIAGNOSIS — R82998 Other abnormal findings in urine: Secondary | ICD-10-CM | POA: Diagnosis not present

## 2013-07-08 DIAGNOSIS — R32 Unspecified urinary incontinence: Secondary | ICD-10-CM | POA: Diagnosis not present

## 2013-07-08 DIAGNOSIS — R3915 Urgency of urination: Secondary | ICD-10-CM | POA: Diagnosis not present

## 2013-07-08 DIAGNOSIS — R350 Frequency of micturition: Secondary | ICD-10-CM | POA: Diagnosis not present

## 2013-07-11 ENCOUNTER — Telehealth: Payer: Self-pay | Admitting: Family Medicine

## 2013-07-12 ENCOUNTER — Other Ambulatory Visit: Payer: Self-pay | Admitting: Family Medicine

## 2013-07-12 MED ORDER — FLUCONAZOLE 150 MG PO TABS: 150.0000 mg | ORAL_TABLET | Freq: Once | ORAL | Status: DC

## 2013-07-12 NOTE — Telephone Encounter (Signed)
Diflucan sent to pharm 

## 2013-07-23 ENCOUNTER — Encounter: Payer: Self-pay | Admitting: *Deleted

## 2013-10-08 ENCOUNTER — Ambulatory Visit: Payer: Medicare Other | Admitting: Family Medicine

## 2013-10-16 ENCOUNTER — Encounter: Payer: Self-pay | Admitting: Family Medicine

## 2013-10-16 ENCOUNTER — Ambulatory Visit (INDEPENDENT_AMBULATORY_CARE_PROVIDER_SITE_OTHER): Payer: Medicare Other | Admitting: Family Medicine

## 2013-10-16 VITALS — BP 113/78 | HR 83 | Temp 96.9°F | Ht 61.0 in | Wt 136.0 lb

## 2013-10-16 DIAGNOSIS — E7800 Pure hypercholesterolemia, unspecified: Secondary | ICD-10-CM

## 2013-10-16 DIAGNOSIS — R059 Cough, unspecified: Secondary | ICD-10-CM | POA: Diagnosis not present

## 2013-10-16 DIAGNOSIS — K219 Gastro-esophageal reflux disease without esophagitis: Secondary | ICD-10-CM | POA: Diagnosis not present

## 2013-10-16 DIAGNOSIS — E559 Vitamin D deficiency, unspecified: Secondary | ICD-10-CM | POA: Diagnosis not present

## 2013-10-16 DIAGNOSIS — J301 Allergic rhinitis due to pollen: Secondary | ICD-10-CM | POA: Diagnosis not present

## 2013-10-16 DIAGNOSIS — R05 Cough: Secondary | ICD-10-CM | POA: Diagnosis not present

## 2013-10-16 DIAGNOSIS — I10 Essential (primary) hypertension: Secondary | ICD-10-CM

## 2013-10-16 DIAGNOSIS — Z789 Other specified health status: Secondary | ICD-10-CM

## 2013-10-16 DIAGNOSIS — M546 Pain in thoracic spine: Secondary | ICD-10-CM

## 2013-10-16 DIAGNOSIS — M4120 Other idiopathic scoliosis, site unspecified: Secondary | ICD-10-CM

## 2013-10-16 DIAGNOSIS — Z888 Allergy status to other drugs, medicaments and biological substances status: Secondary | ICD-10-CM

## 2013-10-16 LAB — POCT CBC
GRANULOCYTE PERCENT: 69 % (ref 37–80)
HEMATOCRIT: 44.3 % (ref 37.7–47.9)
Hemoglobin: 15 g/dL (ref 12.2–16.2)
Lymph, poc: 1.6 (ref 0.6–3.4)
MCH: 29.4 pg (ref 27–31.2)
MCHC: 33.9 g/dL (ref 31.8–35.4)
MCV: 86.9 fL (ref 80–97)
MPV: 8.3 fL (ref 0–99.8)
POC Granulocyte: 4.1 (ref 2–6.9)
POC LYMPH PERCENT: 27.2 %L (ref 10–50)
Platelet Count, POC: 218 10*3/uL (ref 142–424)
RBC: 5.1 M/uL (ref 4.04–5.48)
RDW, POC: 13.3 %
WBC: 5.9 10*3/uL (ref 4.6–10.2)

## 2013-10-16 MED ORDER — AZELASTINE HCL 0.1 % NA SOLN: 1.0000 | Freq: Two times a day (BID) | NASAL | Status: DC

## 2013-10-16 MED ORDER — AMLODIPINE BESYLATE-VALSARTAN 5-320 MG PO TABS: 1.0000 | ORAL_TABLET | Freq: Every day | ORAL | Status: DC

## 2013-10-16 MED ORDER — FLUTICASONE PROPIONATE 50 MCG/ACT NA SUSP: 2.0000 | Freq: Every day | NASAL | Status: DC

## 2013-10-16 NOTE — Progress Notes (Signed)
Subjective:    Patient ID: Tammy [name], female    DOB: 1940/03/23, 73 y.o.   MRN: [id number]  HPI Pt here for follow up and management of chronic medical problems. The patient has no complaints as usual she had blood work drawn this morning. She does need a refill on her Exforge. The patient indicates she has been using Flonase. All of her head congestion and slight cough started after mowing the yard. She says the drainage has some slight color but is mostly white. She denies headache she denies fever. She is mostly drainage. She had lab work drawn this morning.      Patient Active Problem List   Diagnosis Date Noted  . 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 GERD (gastroesophageal reflux disease) 03/26/2013  . Statin intolerance 03/26/2013  . Oral bisphosphonates causing adverse effect in therapeutic use 03/26/2013  . HYPERCHOLESTEROLEMIA 08/06/2008  . HYPERTENSION 08/06/2008   Outpatient Encounter Prescriptions as of 10/16/2013  Medication Sig  . amLODipine-valsartan (EXFORGE) 5-320 MG per tablet Take 1 tablet by mouth daily.  . Omega-3 Fatty Acids (FISH OIL) 1000 MG CAPS Take 2 capsules by mouth daily.  Marland Kitchen omeprazole (PRILOSEC) 20 MG capsule Take 20 mg by mouth 2 (two) times daily.  . [DISCONTINUED] amLODipine-valsartan (EXFORGE) 5-320 MG per tablet Take 1 tablet by mouth daily.  . [DISCONTINUED] metroNIDAZOLE (METROGEL VAGINAL) 0.75 % vaginal gel Place 1 Applicatorful vaginally 2 (two) times daily.  . [DISCONTINUED] Vitamin D, Ergocalciferol, (DRISDOL) 50000 UNITS CAPS capsule Take 1 capsule (50,000 Units total) by mouth every 7 (seven) days.  . [DISCONTINUED] fluconazole (DIFLUCAN) 150 MG tablet Take 1 tablet (150 mg total) by mouth once.  . [DISCONTINUED] MOVIPREP 100 G SOLR Use per prep instruction    Review of Systems  Constitutional: Negative.   HENT: Negative.   Eyes: Negative.   Respiratory: Negative.   Cardiovascular: Negative.     Gastrointestinal: Negative.   Endocrine: Negative.   Genitourinary: Negative.   Musculoskeletal: Negative.   Skin: Negative.   Allergic/Immunologic: Negative.   Neurological: Negative.   Hematological: Negative.   Psychiatric/Behavioral: Negative.        Objective:   Physical Exam  Nursing note and vitals reviewed. Constitutional: She is oriented to person, place, and time. She appears well-developed and well-nourished.  HENT:  Head: Normocephalic and atraumatic.  Right Ear: External ear normal.  Left Ear: External ear normal.  Mouth/Throat: Oropharynx is clear and moist.  Nasal congestion and turbinate swelling and pallor bilaterally  Eyes: Conjunctivae and EOM are normal. Pupils are equal, round, and reactive to light. Right eye exhibits no discharge. Left eye exhibits no discharge. No scleral icterus.  Neck: Normal range of motion. Neck supple. No JVD present. No thyromegaly present.  No carotid bruits or anterior cervical adenopathy  Cardiovascular: Normal rate, regular rhythm, normal heart sounds and intact distal pulses.  Exam reveals no gallop and no friction rub.   No murmur heard. At 72 per minute  Pulmonary/Chest: Effort normal and breath sounds normal. No respiratory distress. She has no wheezes. She has no rales. She exhibits no tenderness.   Dry cough  Abdominal: Soft. Bowel sounds are normal. She exhibits no mass. There is no tenderness. There is no rebound and no guarding.  No abdominal bruits  Musculoskeletal: Normal range of motion. She exhibits no edema.  Lymphadenopathy:    She has no cervical adenopathy.  Neurological: She is alert and oriented to person, place, and time. She has  normal reflexes. No cranial nerve deficit.  Skin: Skin is warm and dry. No rash noted.  Psychiatric: She has a normal mood and affect. Her behavior is normal. Judgment and thought content normal.    BP 113/78  Pulse 83  Temp(Src) 96.9 F (36.1 C) (Oral)  Ht 5' 1"  (1.549 m)   Wt 136 lb (61.689 kg)  BMI 25.71 kg/m2       Assessment & Plan:  1. Gastroesophageal reflux disease, esophagitis presence not specified - POCT CBC  2. HYPERCHOLESTEROLEMIA - POCT CBC - NMR, lipoprofile  3. HYPERTENSION - POCT CBC - BMP8+EGFR - Hepatic function panel  4. Vitamin D deficiency - Vit D  25 hydroxy (rtn osteoporosis monitoring)  5. Allergic rhinitis due to pollen  6. Statin intolerance  7. Cough  8. Bilateral thoracic back pain  9. Scoliosis (and kyphoscoliosis), idiopathic  Meds ordered this encounter  Medications  . amLODipine-valsartan (EXFORGE) 5-320 MG per tablet    Sig: Take 1 tablet by mouth daily.    Dispense:  30 tablet    Refill:  3  . azelastine (ASTELIN) 0.1 % nasal spray    Sig: Place 1 spray into both nostrils 2 (two) times daily. Use in each nostril as directed    Dispense:  30 mL    Refill:  12  . fluticasone (FLONASE) 50 MCG/ACT nasal spray    Sig: Place 2 sprays into both nostrils daily.    Dispense:  16 g    Refill:  6   Patient Instructions                       Medicare Annual Wellness Visit  [facility] and the medical providers at [facility] strive to bring you the best medical care.  In doing so we not only want to address your current medical conditions and concerns but also to detect new conditions early and prevent illness, disease and health-related problems.    Medicare offers a yearly Wellness Visit which allows our clinical staff to assess your need for preventative services including immunizations, lifestyle education, counseling to decrease risk of preventable diseases and screening for fall risk and other medical concerns.    This visit is provided free of charge (no copay) for all Medicare recipients. The clinical pharmacists at [facility] have begun to conduct these Wellness Visits which will also include a thorough review of all your medications.    As you primary  medical provider recommend that you make an appointment for your Annual Wellness Visit if you have not done so already this year.  You may set up this appointment before you leave today or you may call back ([phone]) and schedule an appointment.  Please make sure when you call that you mention that you are scheduling your Annual Wellness Visit with the clinical pharmacist so that the appointment may be made for the proper length of time.      Continue current medications. Continue good therapeutic lifestyle changes which include good diet and exercise. Fall precautions discussed with patient. If an FOBT was given today- please return it to our front desk. If you are over 86 years old - you may need Prevnar 1 or the adult Pneumonia vaccine.  Use both nose sprays as directed Continue to use saline nose spray during the day Drink plenty of water Use or wear a mask when working in the yard Use Mucinex maximum strength one twice daily with  a large glass of water to keep congestion loose   Arrie Senate MD

## 2013-10-16 NOTE — Patient Instructions (Addendum)
Medicare Annual Wellness Visit  [facility] and the medical providers at [facility] strive to bring you the best medical care.  In doing so we not only want to address your current medical conditions and concerns but also to detect new conditions early and prevent illness, disease and health-related problems.    Medicare offers a yearly Wellness Visit which allows our clinical staff to assess your need for preventative services including immunizations, lifestyle education, counseling to decrease risk of preventable diseases and screening for fall risk and other medical concerns.    This visit is provided free of charge (no copay) for all Medicare recipients. The clinical pharmacists at [facility] have begun to conduct these Wellness Visits which will also include a thorough review of all your medications.    As you primary medical provider recommend that you make an appointment for your Annual Wellness Visit if you have not done so already this year.  You may set up this appointment before you leave today or you may call back ([phone]) and schedule an appointment.  Please make sure when you call that you mention that you are scheduling your Annual Wellness Visit with the clinical pharmacist so that the appointment may be made for the proper length of time.      Continue current medications. Continue good therapeutic lifestyle changes which include good diet and exercise. Fall precautions discussed with patient. If an FOBT was given today- please return it to our front desk. If you are over 66 years old - you may need Prevnar 14 or the adult Pneumonia vaccine.  Use both nose sprays as directed Continue to use saline nose spray during the day Drink plenty of water Use or wear a mask when working in the yard Use Mucinex maximum strength one twice daily with a large glass of water to keep congestion loose

## 2013-10-17 LAB — NMR, LIPOPROFILE
Cholesterol: 282 mg/dL — ABNORMAL HIGH (ref 100–199)
HDL Cholesterol by NMR: 52 mg/dL (ref >39)
HDL PARTICLE NUMBER: 33.3 umol/L (ref >=30.5)
LDL Particle Number: 2361 nmol/L — ABNORMAL HIGH (ref <1000)
LDL Size: 21.1 nm (ref >20.5)
LDLC SERPL CALC-MCNC: 205 mg/dL — AB (ref 0–99)
LP-IR SCORE: 42 (ref <=45)
Small LDL Particle Number: 733 nmol/L — ABNORMAL HIGH (ref <=527)
Triglycerides by NMR: 126 mg/dL (ref 0–149)

## 2013-10-17 LAB — BMP8+EGFR
BUN / CREAT RATIO: 11 (ref 11–26)
BUN: 9 mg/dL (ref 8–27)
CHLORIDE: 104 mmol/L (ref 97–108)
CO2: 27 mmol/L (ref 18–29)
Calcium: 10.2 mg/dL (ref 8.7–10.3)
Creatinine, Ser: 0.82 mg/dL (ref 0.57–1.00)
GFR calc non Af Amer: 71 mL/min/{1.73_m2} (ref >59)
GFR, EST AFRICAN AMERICAN: 82 mL/min/{1.73_m2} (ref >59)
Glucose: 86 mg/dL (ref 65–99)
Potassium: 4.5 mmol/L (ref 3.5–5.2)
Sodium: 143 mmol/L (ref 134–144)

## 2013-10-17 LAB — HEPATIC FUNCTION PANEL
ALK PHOS: 60 IU/L (ref 39–117)
ALT: 11 IU/L (ref 0–32)
AST: 17 IU/L (ref 0–40)
Albumin: 4.6 g/dL (ref 3.5–4.8)
BILIRUBIN TOTAL: 0.6 mg/dL (ref 0.0–1.2)
Bilirubin, Direct: 0.16 mg/dL (ref 0.00–0.40)
Total Protein: 6.4 g/dL (ref 6.0–8.5)

## 2013-10-17 LAB — VITAMIN D 25 HYDROXY (VIT D DEFICIENCY, FRACTURES): VIT D 25 HYDROXY: 25.5 ng/mL — AB (ref 30.0–100.0)

## 2013-10-21 ENCOUNTER — Other Ambulatory Visit: Payer: Self-pay | Admitting: Family Medicine

## 2013-10-23 DIAGNOSIS — E559 Vitamin D deficiency, unspecified: Secondary | ICD-10-CM | POA: Diagnosis not present

## 2013-10-23 DIAGNOSIS — E213 Hyperparathyroidism, unspecified: Secondary | ICD-10-CM | POA: Diagnosis not present

## 2013-10-23 DIAGNOSIS — M810 Age-related osteoporosis without current pathological fracture: Secondary | ICD-10-CM | POA: Diagnosis not present

## 2013-11-06 ENCOUNTER — Encounter: Payer: Self-pay | Admitting: Pharmacist

## 2013-11-06 ENCOUNTER — Ambulatory Visit (INDEPENDENT_AMBULATORY_CARE_PROVIDER_SITE_OTHER): Payer: Medicare Other | Admitting: Pharmacist

## 2013-11-06 VITALS — BP 130/83 | HR 78

## 2013-11-06 DIAGNOSIS — E7800 Pure hypercholesterolemia, unspecified: Secondary | ICD-10-CM

## 2013-11-06 NOTE — Progress Notes (Signed)
Lipid Clinic Consultation  Chief Complaint:   Chief Complaint  Patient presents with  . Hyperlipidemia     HPI:  Patient was referred by PCP Dr Laurance Flatten to discuss non pharmacological ways of treating hyperlipidemia.  Patient's most recent LDL was 205.  She has tried multiple statins in past and is unable to tolerate due to myalgias.  She also refuses to try any other meds for hyperlipidemia.   She has a family history for stroke in both parents but both lived into their last 11's / early 43's.       Component Value Date/Time   CHOL 282 10/17/2013   LDL-P 2361 10/17/2013   LDL-C 205 10/17/2013   HDL -C 52 10/17/2013   TRIG 126 10/17/2013    CHD/CHF Risk Equivalents:  none [place] Estd 22yrs risk: 16.2% Primary Problem(s):  LDL or LDL-P elevated  Current NCEP Goals: LDL Goal < 100 HDL Goal >/= 45 Tg Goal < 150 Non-HDL Goal < 130  Secondary cause of hyperlipidemia present:  none Low fat diet followed?  Yes - doesn't eat meat, limits dairy products Low carb diet followed?  Yes -   Exercise?  No -    Carotid Bruits:  none Xanthomas:  Possible xanthomous tendons - but this has never been diagnosed in chart but patient mentions problems with hardening of tendons in her hands General Appearance:  alert, oriented, no acute distress and well nourished Mood/Affect:  normal    Recommendations: Changes in lipid medication(s):  None but I did give her patient information about Repantha - new treatment for FHL Discussed increasing fiber in diet and to add a fiber supplement though studies have not found this very efficacious Reviewed diet and there might be a few changes she might make to help with lipids.  Encouraged restart daily exercise.   Recheck Lipid Panel:  3 months   Time spent counseling patient:  20 minuts  Referring Provider:  Laurance Flatten   PharmD:  Cherre Robins, PHARMD, CPP, CDE

## 2014-01-15 ENCOUNTER — Telehealth: Payer: Self-pay | Admitting: Family Medicine

## 2014-01-15 NOTE — Telephone Encounter (Signed)
Advise

## 2014-01-16 NOTE — Telephone Encounter (Signed)
Earlier appt made

## 2014-01-17 DIAGNOSIS — F329 Major depressive disorder, single episode, unspecified: Secondary | ICD-10-CM | POA: Diagnosis not present

## 2014-01-17 DIAGNOSIS — J019 Acute sinusitis, unspecified: Secondary | ICD-10-CM | POA: Diagnosis not present

## 2014-01-19 ENCOUNTER — Ambulatory Visit (INDEPENDENT_AMBULATORY_CARE_PROVIDER_SITE_OTHER): Payer: Medicare Other | Admitting: Family Medicine

## 2014-01-19 ENCOUNTER — Encounter: Payer: Self-pay | Admitting: Family Medicine

## 2014-01-19 ENCOUNTER — Encounter: Payer: Self-pay | Admitting: *Deleted

## 2014-01-19 VITALS — BP 146/93 | HR 71 | Temp 97.6°F | Ht 61.0 in | Wt 142.0 lb

## 2014-01-19 DIAGNOSIS — M6530 Trigger finger, unspecified finger: Secondary | ICD-10-CM

## 2014-01-19 DIAGNOSIS — I10 Essential (primary) hypertension: Secondary | ICD-10-CM

## 2014-01-19 DIAGNOSIS — M2669 Other specified disorders of temporomandibular joint: Secondary | ICD-10-CM | POA: Diagnosis not present

## 2014-01-19 DIAGNOSIS — F4323 Adjustment disorder with mixed anxiety and depressed mood: Secondary | ICD-10-CM

## 2014-01-19 DIAGNOSIS — J040 Acute laryngitis: Secondary | ICD-10-CM

## 2014-01-19 DIAGNOSIS — M26649 Arthritis of unspecified temporomandibular joint: Secondary | ICD-10-CM

## 2014-01-19 NOTE — Patient Instructions (Addendum)
Take protonix twice a day BEFORE meals Take Ibuprofen twice a day AFTER meals  Use bandaid as directed. Take Mucinex regularly for cough and congestion Rest your voice Gargle with warm salty water Use warm wet compresses on the TM joints bilaterally 20 minutes 3 or 4 times daily Drink plenty of fluids Keep temperature at home as cool as possible Take blood pressure medication regularly Finish antibiotic On her return visit in a little over a week we will consider trying an antidepressant We will give you a note stating that you need to be in better health before the court case is resumed

## 2014-01-19 NOTE — Progress Notes (Signed)
Subjective:    Patient ID: Tammy [name], female    DOB: 06/04/1940, 73 y.o.   MRN: [id number]  HPI Patient here today for jaw pain and anxiety. Since making this appointment she has been seen by the [facility] for Laryngitis and was given an antibiotic.please see note from urgent care. The patient is under a lot of stress. Her brother has cancer, she is being sued by her husband with a divorce in the works. Her sister-in-law has just been placed in an Alzheimer's unit.         Patient Active Problem List   Diagnosis Date Noted  . 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 GERD (gastroesophageal reflux disease) 03/26/2013  . Statin intolerance 03/26/2013  . Oral bisphosphonates causing adverse effect in therapeutic use 03/26/2013  . HYPERCHOLESTEROLEMIA 08/06/2008  . HYPERTENSION 08/06/2008   Outpatient Encounter Prescriptions as of 01/19/2014  Medication Sig  . amLODipine-valsartan (EXFORGE) 5-320 MG per tablet Take 1 tablet by mouth daily.  Marland Kitchen amoxicillin (AMOXIL) 500 MG capsule Take 500 mg by mouth 3 (three) times daily.  Marland Kitchen azelastine (ASTELIN) 0.1 % nasal spray Place 1 spray into both nostrils 2 (two) times daily. Use in each nostril as directed  . fluticasone (FLONASE) 50 MCG/ACT nasal spray Place 2 sprays into both nostrils daily.  . Omega-3 Fatty Acids (FISH OIL) 1000 MG CAPS Take 2 capsules by mouth daily.  Marland Kitchen omeprazole (PRILOSEC) 20 MG capsule Take 20 mg by mouth 2 (two) times daily.  . Vitamin D, Ergocalciferol, (DRISDOL) 50000 UNITS CAPS capsule TAKE 1 CAPSULE EVERY WEEK    Review of Systems  Constitutional: Negative.   HENT: Positive for congestion and voice change (laryngitis).        Jaw pain  Eyes: Negative.   Respiratory: Negative.   Cardiovascular: Negative.   Gastrointestinal: Negative.   Endocrine: Negative.   Genitourinary: Negative.   Musculoskeletal: Negative.   Skin: Negative.   Allergic/Immunologic:  Negative.   Neurological: Negative.   Hematological: Negative.   Psychiatric/Behavioral: The patient is nervous/anxious.        Objective:   Physical Exam  Constitutional: She is oriented to person, place, and time. She appears distressed.  She is obviously distressed and feeling poorly today. She is having trouble speaking because of laryngitis and is worried about multiple issues are going on in her life currently.  HENT:  Head: Normocephalic and atraumatic.  Right Ear: External ear normal.  Left Ear: External ear normal.  Mouth/Throat: Oropharynx is clear and moist. No oropharyngeal exudate.  Nasal congestion bilaterally  Eyes: Conjunctivae and EOM are normal. Pupils are equal, round, and reactive to light. Right eye exhibits no discharge. Left eye exhibits no discharge. No scleral icterus.  Neck: Normal range of motion. Neck supple. No thyromegaly present.  Cardiovascular: Normal rate, regular rhythm and normal heart sounds.   No murmur heard. Pulmonary/Chest: Effort normal and breath sounds normal. No respiratory distress. She has no wheezes. She has no rales. She exhibits no tenderness.  Dry cough  Abdominal: She exhibits no mass.  Musculoskeletal: Normal range of motion. She exhibits no edema.  Lymphadenopathy:    She has no cervical adenopathy.  Neurological: She is alert and oriented to person, place, and time.  Skin: Skin is warm and dry. No rash noted.  Psychiatric: She has a normal mood and affect. Her behavior is normal. Judgment and thought content normal.  Nursing note and vitals reviewed.  BP 146/93 mmHg  Pulse  71  Temp(Src) 97.6 F (36.4 C) (Oral)  Ht 5\' 1"  (1.549 m)  Wt 142 lb (64.411 kg)  BMI 26.84 kg/m2        Assessment & Plan:   1. Laryngitis  2. Adjustment disorder with mixed anxiety and depressed mood  3. TMJ arthritis  4. Trigger finger of left hand  5. Essential hypertension   Patient Instructions  Take protonix twice a day BEFORE  meals Take Ibuprofen twice a day AFTER meals  Use bandaid as directed. Take Mucinex regularly for cough and congestion Rest your voice Gargle with warm salty water Use warm wet compresses on the TM joints bilaterally 20 minutes 3 or 4 times daily Drink plenty of fluids Keep temperature at home as cool as possible Take blood pressure medication regularly Finish antibiotic On her return visit in a little over a week we will consider trying an antidepressant We will give you a note stating that you need to be in better health before the court case is resumed   Arrie Senate MD

## 2014-01-27 ENCOUNTER — Encounter: Payer: Self-pay | Admitting: Family Medicine

## 2014-01-27 ENCOUNTER — Ambulatory Visit (INDEPENDENT_AMBULATORY_CARE_PROVIDER_SITE_OTHER): Payer: Medicare Other | Admitting: Family Medicine

## 2014-01-27 VITALS — BP 111/80 | HR 90 | Temp 97.2°F | Ht 61.0 in | Wt 144.0 lb

## 2014-01-27 DIAGNOSIS — F411 Generalized anxiety disorder: Secondary | ICD-10-CM

## 2014-01-27 DIAGNOSIS — M2669 Other specified disorders of temporomandibular joint: Secondary | ICD-10-CM | POA: Diagnosis not present

## 2014-01-27 DIAGNOSIS — J040 Acute laryngitis: Secondary | ICD-10-CM | POA: Diagnosis not present

## 2014-01-27 DIAGNOSIS — M26649 Arthritis of unspecified temporomandibular joint: Secondary | ICD-10-CM

## 2014-01-27 NOTE — Patient Instructions (Signed)
Continue prescription nose sprays as doing Continue saline nose spray during the day Finish the antibiotic See the dentist regarding your lower plate as this may be playing a role with her jaw pain Continue to drink plenty of fluids and keep the house as cool as possible Gargle with warm salty water

## 2014-01-27 NOTE — Progress Notes (Signed)
Subjective:    Patient ID: Tammy [name], female    DOB: [birth year], 73 y.o.   MRN: [id number]  HPI Patient here today for 1 week follow up on laryngitis. The patient comes in today and is looking better and feeling better from her last visit.       Patient Active Problem List   Diagnosis Date Noted  . 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 GERD (gastroesophageal reflux disease) 03/26/2013  . Statin intolerance 03/26/2013  . Oral bisphosphonates causing adverse effect in therapeutic use 03/26/2013  . HYPERCHOLESTEROLEMIA 08/06/2008  . HYPERTENSION 08/06/2008   Outpatient Encounter Prescriptions as of 01/27/2014  Medication Sig  . amLODipine-valsartan (EXFORGE) 5-320 MG per tablet Take 1 tablet by mouth daily.  Marland Kitchen amoxicillin (AMOXIL) 500 MG capsule Take 500 mg by mouth 3 (three) times daily.  Marland Kitchen azelastine (ASTELIN) 0.1 % nasal spray Place 1 spray into both nostrils 2 (two) times daily. Use in each nostril as directed  . fluticasone (FLONASE) 50 MCG/ACT nasal spray Place 2 sprays into both nostrils daily.  . Omega-3 Fatty Acids (FISH OIL) 1000 MG CAPS Take 2 capsules by mouth daily.  Marland Kitchen omeprazole (PRILOSEC) 20 MG capsule Take 20 mg by mouth 2 (two) times daily.  . Vitamin D, Ergocalciferol, (DRISDOL) 50000 UNITS CAPS capsule TAKE 1 CAPSULE EVERY WEEK    Review of Systems  Constitutional: Negative.   HENT: Negative.   Eyes: Negative.   Respiratory: Negative.   Cardiovascular: Negative.   Gastrointestinal: Negative.   Endocrine: Negative.   Genitourinary: Negative.   Musculoskeletal: Negative.   Skin: Negative.   Allergic/Immunologic: Negative.   Neurological: Negative.   Hematological: Negative.   Psychiatric/Behavioral: Negative.        Objective:   Physical Exam  Constitutional: She is oriented to person, place, and time. She appears well-developed and well-nourished.  HENT:  Head: Normocephalic and atraumatic.  Right Ear:  External ear normal.  Left Ear: External ear normal.  Nose: Nose normal.  Mouth/Throat: Oropharynx is clear and moist.  Eyes: Conjunctivae are normal. Pupils are equal, round, and reactive to light. Right eye exhibits no discharge. Left eye exhibits no discharge. No scleral icterus.  Neck: Normal range of motion. Neck supple. No thyromegaly present.  Cardiovascular: Normal rate, regular rhythm and normal heart sounds.   No murmur heard. Pulmonary/Chest: Effort normal and breath sounds normal. No respiratory distress. She has no wheezes. She has no rales.  Musculoskeletal: Normal range of motion. She exhibits no tenderness.  Appears to be less tender at the left TMJ  Neurological: She is alert and oriented to person, place, and time.  Skin: Skin is warm and dry. No rash noted.  Psychiatric: She has a normal mood and affect. Her behavior is normal. Judgment and thought content normal.  The patient's affect is more positive today  Vitals reviewed.  BP 111/80 mmHg  Pulse 90  Temp(Src) 97.2 F (36.2 C) (Oral)  Ht 5\' 1"  (1.549 m)  Wt 144 lb (65.318 kg)  BMI 27.22 kg/m2        Assessment & Plan:  1. Laryngitis -This is improved  2. Generalized anxiety disorder -The patient continues to work at resolving the family issues that have made her so upset  3. TMJ arthritis -Follow up with dentist regarding the lower plate fitting for your mouth  Patient Instructions  Continue prescription nose sprays as doing Continue saline nose spray during the day Finish the antibiotic See the dentist regarding your  lower plate as this may be playing a role with her jaw pain Continue to drink plenty of fluids and keep the house as cool as possible Gargle with warm salty water    Arrie Senate MD

## 2014-02-16 ENCOUNTER — Ambulatory Visit (INDEPENDENT_AMBULATORY_CARE_PROVIDER_SITE_OTHER): Payer: Medicare Other

## 2014-02-16 ENCOUNTER — Ambulatory Visit (INDEPENDENT_AMBULATORY_CARE_PROVIDER_SITE_OTHER): Payer: Medicare Other | Admitting: Family Medicine

## 2014-02-16 ENCOUNTER — Encounter: Payer: Self-pay | Admitting: Family Medicine

## 2014-02-16 VITALS — BP 124/84 | HR 75 | Temp 97.6°F | Ht 61.0 in | Wt 145.0 lb

## 2014-02-16 DIAGNOSIS — I700 Atherosclerosis of aorta: Secondary | ICD-10-CM

## 2014-02-16 DIAGNOSIS — K219 Gastro-esophageal reflux disease without esophagitis: Secondary | ICD-10-CM | POA: Diagnosis not present

## 2014-02-16 DIAGNOSIS — E7800 Pure hypercholesterolemia, unspecified: Secondary | ICD-10-CM

## 2014-02-16 DIAGNOSIS — R05 Cough: Secondary | ICD-10-CM | POA: Diagnosis not present

## 2014-02-16 DIAGNOSIS — R059 Cough, unspecified: Secondary | ICD-10-CM

## 2014-02-16 DIAGNOSIS — M4120 Other idiopathic scoliosis, site unspecified: Secondary | ICD-10-CM

## 2014-02-16 DIAGNOSIS — M545 Low back pain, unspecified: Secondary | ICD-10-CM

## 2014-02-16 DIAGNOSIS — M5136 Other intervertebral disc degeneration, lumbar region: Secondary | ICD-10-CM

## 2014-02-16 DIAGNOSIS — I10 Essential (primary) hypertension: Secondary | ICD-10-CM

## 2014-02-16 DIAGNOSIS — E559 Vitamin D deficiency, unspecified: Secondary | ICD-10-CM | POA: Diagnosis not present

## 2014-02-16 MED ORDER — PREDNISONE 10 MG PO TABS: ORAL_TABLET | ORAL | Status: DC

## 2014-02-16 NOTE — Progress Notes (Signed)
Subjective:    Patient ID: Tammy [name], female    DOB: 04-Dec-1940, 73 y.o.   MRN: [id number]  HPI Pt here for follow up and management of chronic medical problems. The patient comes to the visit today for follow-up of multiple medical problems. She has been under a lot of stress recently and going through a divorce. She is having a lot of back pain. And she would like to go see someone about this. X-rays from over a year ago showed multilevel degenerative disc disease and scoliosis. She may benefit from some injections in her back. The patient continues to have a cough and has had multiple rounds of antibiotics without any improvement in this. Her last chest x-ray was a little over one year ago.   Patient Active Problem List   Diagnosis Date Noted  . 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 GERD (gastroesophageal reflux disease) 03/26/2013  . Statin intolerance 03/26/2013  . Oral bisphosphonates causing adverse effect in therapeutic use 03/26/2013  . HYPERCHOLESTEROLEMIA 08/06/2008  . HYPERTENSION 08/06/2008   Outpatient Encounter Prescriptions as of 02/16/2014  Medication Sig  . amLODipine-valsartan (EXFORGE) 5-320 MG per tablet Take 1 tablet by mouth daily.  Marland Kitchen azelastine (ASTELIN) 0.1 % nasal spray Place 1 spray into both nostrils 2 (two) times daily. Use in each nostril as directed  . fluticasone (FLONASE) 50 MCG/ACT nasal spray Place 2 sprays into both nostrils daily.  . Omega-3 Fatty Acids (FISH OIL) 1000 MG CAPS Take 2 capsules by mouth daily.  Marland Kitchen omeprazole (PRILOSEC) 20 MG capsule Take 20 mg by mouth 2 (two) times daily.  . Vitamin D, Ergocalciferol, (DRISDOL) 50000 UNITS CAPS capsule TAKE 1 CAPSULE EVERY WEEK  . [DISCONTINUED] amoxicillin (AMOXIL) 500 MG capsule Take 500 mg by mouth 3 (three) times daily.    Review of Systems  Constitutional: Negative.   HENT: Negative.   Eyes: Negative.   Respiratory: Negative.   Cardiovascular: Negative.     Gastrointestinal: Negative.   Endocrine: Negative.   Genitourinary: Negative.   Musculoskeletal: Positive for back pain.  Skin: Negative.   Allergic/Immunologic: Negative.   Neurological: Negative.   Hematological: Negative.   Psychiatric/Behavioral: Negative.        Objective:   Physical Exam  Constitutional: She is oriented to person, place, and time. She appears well-developed and well-nourished. No distress.  The patient is pleasant and alert  HENT:  Head: Normocephalic and atraumatic.  Right Ear: External ear normal.  Left Ear: External ear normal.  Mouth/Throat: Oropharynx is clear and moist. No oropharyngeal exudate.  Some nasal congestion  Eyes: Conjunctivae and EOM are normal. Pupils are equal, round, and reactive to light. Right eye exhibits no discharge. Left eye exhibits no discharge. No scleral icterus.  Neck: Normal range of motion. Neck supple. No thyromegaly present.  No anterior cervical nodes or carotid bruits  Cardiovascular: Normal rate, regular rhythm, normal heart sounds and intact distal pulses.   No murmur heard. At 72/m  Pulmonary/Chest: Effort normal and breath sounds normal. No respiratory distress. She has no wheezes. She has no rales. She exhibits no tenderness.  Abdominal: Soft. Bowel sounds are normal. She exhibits no mass. There is no tenderness. There is no rebound and no guarding.  Slight midline abdominal tenderness near the previous scar for her abdominal aortic aneurysm.  Musculoskeletal: Normal range of motion. She exhibits no edema.  The back is tender in the upper lumbar spine area and there is pain in both flanks with  leg raising and hip abduction  Lymphadenopathy:    She has no cervical adenopathy.  Neurological: She is alert and oriented to person, place, and time. She has normal reflexes. No cranial nerve deficit.  Skin: Skin is warm and dry. No rash noted.  Psychiatric: She has a normal mood and affect. Her behavior is normal.  Judgment and thought content normal.  Nursing note and vitals reviewed.  BP 124/84 mmHg  Pulse 75  Temp(Src) 97.6 F (36.4 C) (Oral)  Ht 5' 1"  (1.549 m)  Wt 145 lb (65.772 kg)  BMI 27.41 kg/m2  [facility] reading (PRIMARY) by  Dr. Brunilda Payor x-ray-  no active disease except atherosclerotic thoracic aorta                                     Assessment & Plan:  1. Essential hypertension - POCT CBC - BMP8+EGFR - Hepatic function panel  2. Gastroesophageal reflux disease, esophagitis presence not specified - POCT CBC - Hepatic function panel  3. Vitamin D deficiency - POCT CBC - Vit D  25 hydroxy (rtn osteoporosis monitoring)  4. HYPERCHOLESTEROLEMIA - POCT CBC - NMR, lipoprofile  5. Cough - predniSONE (DELTASONE) 10 MG tablet; 1 tablet 4 times a day for 2 days,  1 tablet 3 times a day for 2 days,  1 tablet 2 times a day for 2 days, 1 tablet daily for 2 days  Dispense: 20 tablet; Refill: 0 - DG Chest 2 View; Future  6. Midline low back pain without sciatica  7. Idiopathic scoliosis  8. Lumbar degenerative disc disease  9. Thoracic aortic atherosclerosis per chest x-ray today  Meds ordered this encounter  Medications  . predniSONE (DELTASONE) 10 MG tablet    Sig: 1 tablet 4 times a day for 2 days,  1 tablet 3 times a day for 2 days,  1 tablet 2 times a day for 2 days, 1 tablet daily for 2 days    Dispense:  20 tablet    Refill:  0   Patient Instructions                       Medicare Annual Wellness Visit  [facility] and the medical providers at [facility] strive to bring you the best medical care.  In doing so we not only want to address your current medical conditions and concerns but also to detect new conditions early and prevent illness, disease and health-related problems.    Medicare offers a yearly Wellness Visit which allows our clinical staff to assess your need for preventative services including immunizations, lifestyle  education, counseling to decrease risk of preventable diseases and screening for fall risk and other medical concerns.    This visit is provided free of charge (no copay) for all Medicare recipients. The clinical pharmacists at [facility] have begun to conduct these Wellness Visits which will also include a thorough review of all your medications.    As you primary medical provider recommend that you make an appointment for your Annual Wellness Visit if you have not done so already this year.  You may set up this appointment before you leave today or you may call back [phone]) and schedule an appointment.  Please make sure when you call that you mention that you are scheduling your Annual Wellness Visit with the clinical pharmacist so that the appointment may  be made for the proper length of time.     Continue current medications. Continue good therapeutic lifestyle changes which include good diet and exercise. Fall precautions discussed with patient. If an FOBT was given today- please return it to our front desk. If you are over 30 years old - you may need Prevnar 92 or the adult Pneumonia vaccine.  Flu Shots will be available at our office starting mid- September. Please call and schedule a FLU CLINIC APPOINTMENT.   Continue to take Mucinex maximum strength 1 twice daily with a large glass of water. This pill is over-the-counter and is blue and white in color. Use a cool mist humidifier in the bedroom at nighttime and drink plenty of fluids We will call you with the chest x-ray results as soon as that result is available We will also call you with your lab work results as soon as those results are available   will also arrange an appointment for you to see the orthopedic surgeon regarding her back pain.  Arrie Senate MD

## 2014-02-16 NOTE — Patient Instructions (Addendum)
Medicare Annual Wellness Visit  [facility] and the medical providers at [facility] strive to bring you the best medical care.  In doing so we not only want to address your current medical conditions and concerns but also to detect new conditions early and prevent illness, disease and health-related problems.    Medicare offers a yearly Wellness Visit which allows our clinical staff to assess your need for preventative services including immunizations, lifestyle education, counseling to decrease risk of preventable diseases and screening for fall risk and other medical concerns.    This visit is provided free of charge (no copay) for all Medicare recipients. The clinical pharmacists at [facility] have begun to conduct these Wellness Visits which will also include a thorough review of all your medications.    As you primary medical provider recommend that you make an appointment for your Annual Wellness Visit if you have not done so already this year.  You may set up this appointment before you leave today or you may call back [phone]) and schedule an appointment.  Please make sure when you call that you mention that you are scheduling your Annual Wellness Visit with the clinical pharmacist so that the appointment may be made for the proper length of time.     Continue current medications. Continue good therapeutic lifestyle changes which include good diet and exercise. Fall precautions discussed with patient. If an FOBT was given today- please return it to our front desk. If you are over 65 years old - you may need Prevnar 19 or the adult Pneumonia vaccine.  Flu Shots will be available at our office starting mid- September. Please call and schedule a FLU CLINIC APPOINTMENT.   Continue to take Mucinex maximum strength 1 twice daily with a large glass of water. This pill is over-the-counter and is blue and white in color. Use a  cool mist humidifier in the bedroom at nighttime and drink plenty of fluids We will call you with the chest x-ray results as soon as that result is available We will also call you with your lab work results as soon as those results are available

## 2014-02-18 ENCOUNTER — Other Ambulatory Visit: Payer: Medicare Other

## 2014-02-19 ENCOUNTER — Other Ambulatory Visit (INDEPENDENT_AMBULATORY_CARE_PROVIDER_SITE_OTHER): Payer: Medicare Other

## 2014-02-19 DIAGNOSIS — Z1212 Encounter for screening for malignant neoplasm of rectum: Secondary | ICD-10-CM

## 2014-02-19 DIAGNOSIS — K219 Gastro-esophageal reflux disease without esophagitis: Secondary | ICD-10-CM

## 2014-02-19 DIAGNOSIS — E559 Vitamin D deficiency, unspecified: Secondary | ICD-10-CM | POA: Diagnosis not present

## 2014-02-19 DIAGNOSIS — E780 Pure hypercholesterolemia: Secondary | ICD-10-CM | POA: Diagnosis not present

## 2014-02-19 DIAGNOSIS — I10 Essential (primary) hypertension: Secondary | ICD-10-CM | POA: Diagnosis not present

## 2014-02-19 LAB — POCT CBC
GRANULOCYTE PERCENT: 69.6 % (ref 37–80)
HCT, POC: 41.7 % (ref 37.7–47.9)
HEMOGLOBIN: 13.7 g/dL (ref 12.2–16.2)
LYMPH, POC: 2 (ref 0.6–3.4)
MCH, POC: 28.8 pg (ref 27–31.2)
MCHC: 33 g/dL (ref 31.8–35.4)
MCV: 87.2 fL (ref 80–97)
MPV: 7.5 fL (ref 0–99.8)
PLATELET COUNT, POC: 262 10*3/uL (ref 142–424)
POC GRANULOCYTE: 5 (ref 2–6.9)
POC LYMPH %: 28.4 % (ref 10–50)
RBC: 4.8 M/uL (ref 4.04–5.48)
RDW, POC: 12.8 %
WBC: 7.2 10*3/uL (ref 4.6–10.2)

## 2014-02-19 NOTE — Addendum Note (Signed)
Addended by: Selmer Dominion on: 02/19/2014 08:57 AM   Modules accepted: Orders

## 2014-02-19 NOTE — Progress Notes (Signed)
Lab only from the 14th

## 2014-02-20 ENCOUNTER — Telehealth: Payer: Self-pay

## 2014-02-20 LAB — VITAMIN D 25 HYDROXY (VIT D DEFICIENCY, FRACTURES): Vit D, 25-Hydroxy: 30.5 ng/mL (ref 30.0–100.0)

## 2014-02-20 LAB — BMP8+EGFR
BUN/Creatinine Ratio: 13 (ref 11–26)
BUN: 9 mg/dL (ref 8–27)
CALCIUM: 10.6 mg/dL — AB (ref 8.7–10.3)
CO2: 27 mmol/L (ref 18–29)
Chloride: 102 mmol/L (ref 97–108)
Creatinine, Ser: 0.7 mg/dL (ref 0.57–1.00)
GFR calc Af Amer: 99 mL/min/{1.73_m2} (ref >59)
GFR calc non Af Amer: 86 mL/min/{1.73_m2} (ref >59)
Glucose: 78 mg/dL (ref 65–99)
Potassium: 4 mmol/L (ref 3.5–5.2)
Sodium: 144 mmol/L (ref 134–144)

## 2014-02-20 LAB — NMR, LIPOPROFILE
CHOLESTEROL: 314 mg/dL — AB (ref 100–199)
HDL CHOLESTEROL BY NMR: 63 mg/dL (ref >39)
HDL Particle Number: 33.6 umol/L (ref >=30.5)
LDL Particle Number: 2230 nmol/L — ABNORMAL HIGH (ref <1000)
LDL SIZE: 21.4 nm (ref >20.5)
LDL-C: 227 mg/dL — ABNORMAL HIGH (ref 0–99)
LP-IR Score: 29 (ref <=45)
SMALL LDL PARTICLE NUMBER: 949 nmol/L — AB (ref <=527)
TRIGLYCERIDES BY NMR: 119 mg/dL (ref 0–149)

## 2014-02-20 LAB — HEPATIC FUNCTION PANEL
ALT: 15 IU/L (ref 0–32)
AST: 16 IU/L (ref 0–40)
Albumin: 4.3 g/dL (ref 3.5–4.8)
Alkaline Phosphatase: 64 IU/L (ref 39–117)
Bilirubin, Direct: 0.1 mg/dL (ref 0.00–0.40)
Total Bilirubin: 0.4 mg/dL (ref 0.0–1.2)
Total Protein: 6.6 g/dL (ref 6.0–8.5)

## 2014-02-20 NOTE — Telephone Encounter (Signed)
-----   Message from Chipper Herb, MD sent at 02/20/2014  7:59 AM EST ----- The blood sugar is good at 78. The creatinine, the most important kidney function test is within normal limits. The electrolytes including potassium are all good and within normal limits except the serum calcium is slightly elevated as it has been in the past. We will continue to monitor this. All liver function tests are within normal limits Wynetta Emery all numbers with advanced lipid tested remains very elevated. The total LDL particle number is 2230. The LDL C is 227. The triglycerides are good. The patient is statin intolerant. Please schedule her for a visit with the clinical pharmacist to see if there are any other options available for helping get her cholesterol down. I.e. low dose statin therapy 3 days weekly? The vitamin D level is good and within normal limits continue current treatment

## 2014-02-20 NOTE — Telephone Encounter (Signed)
Pt aware of results 

## 2014-02-21 LAB — FECAL OCCULT BLOOD, IMMUNOCHEMICAL: Fecal Occult Bld: NEGATIVE

## 2014-03-31 DIAGNOSIS — Z1231 Encounter for screening mammogram for malignant neoplasm of breast: Secondary | ICD-10-CM | POA: Diagnosis not present

## 2014-05-18 ENCOUNTER — Ambulatory Visit: Payer: Self-pay

## 2014-06-16 DIAGNOSIS — R0602 Shortness of breath: Secondary | ICD-10-CM | POA: Diagnosis not present

## 2014-06-16 DIAGNOSIS — J040 Acute laryngitis: Secondary | ICD-10-CM | POA: Diagnosis not present

## 2014-06-16 DIAGNOSIS — J9801 Acute bronchospasm: Secondary | ICD-10-CM | POA: Diagnosis not present

## 2014-06-16 DIAGNOSIS — J209 Acute bronchitis, unspecified: Secondary | ICD-10-CM | POA: Diagnosis not present

## 2014-06-17 DIAGNOSIS — J029 Acute pharyngitis, unspecified: Secondary | ICD-10-CM | POA: Diagnosis not present

## 2014-06-17 DIAGNOSIS — R05 Cough: Secondary | ICD-10-CM | POA: Diagnosis not present

## 2014-06-17 DIAGNOSIS — J189 Pneumonia, unspecified organism: Secondary | ICD-10-CM | POA: Diagnosis not present

## 2014-06-17 DIAGNOSIS — J019 Acute sinusitis, unspecified: Secondary | ICD-10-CM | POA: Diagnosis not present

## 2014-06-18 DIAGNOSIS — J9801 Acute bronchospasm: Secondary | ICD-10-CM | POA: Diagnosis not present

## 2014-06-18 DIAGNOSIS — J209 Acute bronchitis, unspecified: Secondary | ICD-10-CM | POA: Diagnosis not present

## 2014-06-18 DIAGNOSIS — J189 Pneumonia, unspecified organism: Secondary | ICD-10-CM | POA: Diagnosis not present

## 2014-06-18 DIAGNOSIS — J019 Acute sinusitis, unspecified: Secondary | ICD-10-CM | POA: Diagnosis not present

## 2014-06-23 ENCOUNTER — Ambulatory Visit: Payer: Medicare Other | Admitting: Family Medicine

## 2014-06-25 DIAGNOSIS — J019 Acute sinusitis, unspecified: Secondary | ICD-10-CM | POA: Diagnosis not present

## 2014-06-25 DIAGNOSIS — J029 Acute pharyngitis, unspecified: Secondary | ICD-10-CM | POA: Diagnosis not present

## 2014-06-25 DIAGNOSIS — J209 Acute bronchitis, unspecified: Secondary | ICD-10-CM | POA: Diagnosis not present

## 2014-07-07 DIAGNOSIS — J302 Other seasonal allergic rhinitis: Secondary | ICD-10-CM | POA: Diagnosis not present

## 2014-07-07 DIAGNOSIS — H9193 Unspecified hearing loss, bilateral: Secondary | ICD-10-CM | POA: Diagnosis not present

## 2014-07-07 DIAGNOSIS — H903 Sensorineural hearing loss, bilateral: Secondary | ICD-10-CM | POA: Diagnosis not present

## 2014-11-20 ENCOUNTER — Encounter: Payer: Self-pay | Admitting: Family

## 2014-11-20 ENCOUNTER — Ambulatory Visit (INDEPENDENT_AMBULATORY_CARE_PROVIDER_SITE_OTHER): Payer: Medicare Other | Admitting: Family

## 2014-11-20 VITALS — BP 170/103 | HR 86 | Temp 98.1°F | Ht 61.0 in | Wt 142.0 lb

## 2014-11-20 DIAGNOSIS — T593X3A Toxic effect of lacrimogenic gas, assault, initial encounter: Secondary | ICD-10-CM | POA: Diagnosis not present

## 2014-11-20 DIAGNOSIS — T65893A Toxic effect of other specified substances, assault, initial encounter: Secondary | ICD-10-CM
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
## 2014-11-20 NOTE — Patient Instructions (Signed)
Pepper Spray Exposure Pepper spray is an irritant which is used in Control and instrumentation engineer by both civilians and Event organiser. These irritants are commonly described as "less than lethal". This means they are intended to put a person out of action by causing pain, burning and irritation of exposed membranes. Examples of exposed membranes are the lining of your nose mouth and around and in your eyes. They are not intended to kill or cause permanent injury. Pepper spray or oleoresin capsicum (OC) is one of the common irritants used. A derivative of hot peppers, OC is the active ingredient in pepper spray. SYMPTOMS   Severe burning sensation in the eyes, nose and mouth.  Difficulty breathing. DIAGNOSIS  The diagnosis of this problem is usually made by getting the history of what has happened. It may also be suggested on your caregiver's examination. TREATMENT   Usually the most important part of treatment is getting rid of contaminated clothes. The eyes should also be washed with large amounts of water. This is called decontamination.  If clothes have been contaminated, do not resume wearing them until after washing as they may re-expose you to the problem-causing chemical. Many fabrics hold on to the chemical and cannot be re-worn but must be thrown out. SEEK IMMEDIATE MEDICAL CARE IF:   You have increasing pain in your eyes, mouth, throat or any other areas exposed to the pepper spray.  You develop difficulty breathing or you develop cough and or shortness of breath.  You develop problems with your vision.  You develop a rash or any other problem you think is related to the spray. Document Released: 12/13/2004 Document Revised: 05/15/2011 Document Reviewed: 04/17/2008 [facility] Patient Information 2015 [place], [place]. This information is not intended to replace advice given to you by your health care Cambell Rickenbach. Make sure you discuss any questions you have with your health care Dayron Odland.

## 2014-12-25 ENCOUNTER — Telehealth: Payer: Self-pay | Admitting: Family Medicine

## 2015-04-02 DIAGNOSIS — Z1231 Encounter for screening mammogram for malignant neoplasm of breast: Secondary | ICD-10-CM | POA: Diagnosis not present

## 2015-06-07 ENCOUNTER — Other Ambulatory Visit: Payer: Self-pay | Admitting: Family Medicine

## 2015-08-24 ENCOUNTER — Encounter: Payer: Self-pay | Admitting: Family Medicine

## 2015-09-06 ENCOUNTER — Encounter: Payer: Self-pay | Admitting: Family Medicine

## 2015-09-06 ENCOUNTER — Ambulatory Visit (INDEPENDENT_AMBULATORY_CARE_PROVIDER_SITE_OTHER): Payer: Medicare Other | Admitting: Family Medicine

## 2015-09-06 VITALS — BP 154/99 | HR 73 | Temp 97.3°F | Ht 61.0 in | Wt 133.0 lb

## 2015-09-06 DIAGNOSIS — R030 Elevated blood-pressure reading, without diagnosis of hypertension: Secondary | ICD-10-CM | POA: Diagnosis not present

## 2015-09-06 DIAGNOSIS — R413 Other amnesia: Secondary | ICD-10-CM | POA: Diagnosis not present

## 2015-09-06 DIAGNOSIS — IMO0001 Reserved for inherently not codable concepts without codable children: Secondary | ICD-10-CM

## 2015-09-06 DIAGNOSIS — R490 Dysphonia: Secondary | ICD-10-CM | POA: Diagnosis not present

## 2015-09-06 DIAGNOSIS — K4090 Unilateral inguinal hernia, without obstruction or gangrene, not specified as recurrent: Secondary | ICD-10-CM

## 2015-09-06 NOTE — Addendum Note (Signed)
Addended by: Zannie Cove on: 09/06/2015 04:25 PM   Modules accepted: Orders
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
## 2015-09-06 NOTE — Patient Instructions (Addendum)
Schedule an appointment with OB-GYN - Dr Glo Herring The patient's daughter should go with her for the next visit to the [facility] and request an ear nose and throat evaluation because of her persistent hoarseness We will arrange for her to have a scan of her abdomen either an ultrasound or CT scan to further evaluate the lump in the left groin which could be an inguinal hernia. We will get lab work and follow-up with her when those results are obtained In the meantime the patient should drink plenty of fluids and stay well hydrated She should take her blood pressure medicine regularly and come by the office in a couple weeks and have a blood pressure rechecked She should watch her sodium intake

## 2015-09-07 LAB — BMP8+EGFR
BUN / CREAT RATIO: 15 (ref 12–28)
BUN: 12 mg/dL (ref 8–27)
CHLORIDE: 102 mmol/L (ref 96–106)
CO2: 25 mmol/L (ref 18–29)
Calcium: 10.4 mg/dL — ABNORMAL HIGH (ref 8.7–10.3)
Creatinine, Ser: 0.8 mg/dL (ref 0.57–1.00)
GFR calc non Af Amer: 72 mL/min/{1.73_m2} (ref >59)
GFR, EST AFRICAN AMERICAN: 83 mL/min/{1.73_m2} (ref >59)
Glucose: 87 mg/dL (ref 65–99)
Potassium: 4.2 mmol/L (ref 3.5–5.2)
SODIUM: 141 mmol/L (ref 134–144)

## 2015-09-07 LAB — CBC WITH DIFFERENTIAL/PLATELET
Basophils Absolute: 0.1 10*3/uL (ref 0.0–0.2)
Basos: 1 %
EOS (ABSOLUTE): 0.2 10*3/uL (ref 0.0–0.4)
Eos: 4 %
HEMATOCRIT: 41.4 % (ref 34.0–46.6)
HEMOGLOBIN: 13.9 g/dL (ref 11.1–15.9)
Immature Grans (Abs): 0 10*3/uL (ref 0.0–0.1)
Immature Granulocytes: 0 %
Lymphocytes Absolute: 2.4 10*3/uL (ref 0.7–3.1)
Lymphs: 40 %
MCH: 29 pg (ref 26.6–33.0)
MCHC: 33.6 g/dL (ref 31.5–35.7)
MCV: 86 fL (ref 79–97)
MONOCYTES: 9 %
Monocytes Absolute: 0.5 10*3/uL (ref 0.1–0.9)
NEUTROS ABS: 2.8 10*3/uL (ref 1.4–7.0)
Neutrophils: 46 %
Platelets: 237 10*3/uL (ref 150–379)
RBC: 4.8 x10E6/uL (ref 3.77–5.28)
RDW: 14.2 % (ref 12.3–15.4)
WBC: 6 10*3/uL (ref 3.4–10.8)

## 2015-09-07 LAB — HEPATIC FUNCTION PANEL
ALT: 13 IU/L (ref 0–32)
AST: 14 IU/L (ref 0–40)
Albumin: 4.1 g/dL (ref 3.5–4.8)
Alkaline Phosphatase: 64 IU/L (ref 39–117)
BILIRUBIN TOTAL: 0.4 mg/dL (ref 0.0–1.2)
BILIRUBIN, DIRECT: 0.1 mg/dL (ref 0.00–0.40)
TOTAL PROTEIN: 6.4 g/dL (ref 6.0–8.5)

## 2015-09-07 LAB — THYROID PANEL WITH TSH
Free Thyroxine Index: 1.5 (ref 1.2–4.9)
T3 Uptake Ratio: 24 % (ref 24–39)
T4, Total: 6.2 ug/dL (ref 4.5–12.0)
TSH: 3.18 u[IU]/mL (ref 0.450–4.500)

## 2015-09-07 LAB — VITAMIN B12: Vitamin B-12: 218 pg/mL (ref 211–946)

## 2015-09-13 ENCOUNTER — Telehealth: Payer: Self-pay | Admitting: Family Medicine

## 2015-09-14 NOTE — Telephone Encounter (Signed)
Pt aware of appointment date/time; also that she will be receiving a letter with detailed information about the appointment

## 2015-09-20 ENCOUNTER — Ambulatory Visit (INDEPENDENT_AMBULATORY_CARE_PROVIDER_SITE_OTHER): Payer: Medicare Other | Admitting: Obstetrics and Gynecology

## 2015-09-20 ENCOUNTER — Encounter: Payer: Self-pay | Admitting: Obstetrics and Gynecology

## 2015-09-20 VITALS — BP 140/90 | Ht <= 58 in | Wt 133.0 lb

## 2015-09-20 DIAGNOSIS — N904 Leukoplakia of vulva: Secondary | ICD-10-CM

## 2015-09-20 DIAGNOSIS — Z01419 Encounter for gynecological examination (general) (routine) without abnormal findings: Secondary | ICD-10-CM

## 2015-09-20 DIAGNOSIS — R599 Enlarged lymph nodes, unspecified: Secondary | ICD-10-CM | POA: Diagnosis not present

## 2015-09-20 DIAGNOSIS — Z01411 Encounter for gynecological examination (general) (routine) with abnormal findings: Secondary | ICD-10-CM

## 2015-09-20 DIAGNOSIS — R590 Localized enlarged lymph nodes: Secondary | ICD-10-CM | POA: Insufficient documentation
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
## 2015-09-21 ENCOUNTER — Ambulatory Visit (HOSPITAL_COMMUNITY)
Admission: RE | Admit: 2015-09-21 | Discharge: 2015-09-21 | Disposition: A | Discharge status: Home / Self Care | Payer: Medicare Other | Source: Ambulatory Visit | Attending: Family Medicine | Admitting: Family Medicine

## 2015-09-21 ENCOUNTER — Ambulatory Visit (HOSPITAL_COMMUNITY): Payer: Medicare Other

## 2015-09-21 DIAGNOSIS — I2510 Atherosclerotic heart disease of native coronary artery without angina pectoris: Secondary | ICD-10-CM | POA: Diagnosis not present

## 2015-09-21 DIAGNOSIS — I700 Atherosclerosis of aorta: Secondary | ICD-10-CM | POA: Diagnosis not present

## 2015-09-21 DIAGNOSIS — K4090 Unilateral inguinal hernia, without obstruction or gangrene, not specified as recurrent: Secondary | ICD-10-CM | POA: Diagnosis not present

## 2015-09-21 DIAGNOSIS — K7689 Other specified diseases of liver: Secondary | ICD-10-CM | POA: Diagnosis not present

## 2015-09-21 DIAGNOSIS — N281 Cyst of kidney, acquired: Secondary | ICD-10-CM | POA: Diagnosis not present

## 2015-09-21 DIAGNOSIS — Z9889 Other specified postprocedural states: Secondary | ICD-10-CM | POA: Diagnosis not present

## 2015-09-21 DIAGNOSIS — I714 Abdominal aortic aneurysm, without rupture: Secondary | ICD-10-CM | POA: Insufficient documentation

## 2015-09-21 DIAGNOSIS — R935 Abnormal findings on diagnostic imaging of other abdominal regions, including retroperitoneum: Secondary | ICD-10-CM | POA: Diagnosis not present

## 2015-09-22 ENCOUNTER — Telehealth: Payer: Self-pay | Admitting: Family Medicine

## 2015-09-22 NOTE — Telephone Encounter (Signed)
Pt daughter called

## 2015-09-28 ENCOUNTER — Encounter: Payer: Self-pay | Admitting: Family Medicine

## 2015-09-28 ENCOUNTER — Ambulatory Visit (INDEPENDENT_AMBULATORY_CARE_PROVIDER_SITE_OTHER): Payer: Medicare Other | Admitting: Family Medicine

## 2015-09-28 VITALS — BP 114/80 | HR 77 | Temp 97.2°F | Ht <= 58 in | Wt 131.0 lb

## 2015-09-28 DIAGNOSIS — I712 Thoracic aortic aneurysm, without rupture: Secondary | ICD-10-CM

## 2015-09-28 DIAGNOSIS — E7800 Pure hypercholesterolemia, unspecified: Secondary | ICD-10-CM

## 2015-09-28 DIAGNOSIS — I700 Atherosclerosis of aorta: Secondary | ICD-10-CM | POA: Insufficient documentation

## 2015-09-28 DIAGNOSIS — I7121 Aneurysm of the ascending aorta, without rupture: Secondary | ICD-10-CM | POA: Insufficient documentation

## 2015-09-28 NOTE — Addendum Note (Signed)
Addended by: Zannie Cove on: 09/28/2015 11:43 AM   Modules accepted: Orders

## 2015-09-28 NOTE — Progress Notes (Signed)
Subjective:    Patient ID: Tammy [name], female    DOB: [birth year], 75 y.o.   MRN: [id number]  HPI Patient here today for follow up on CT scan of Abdomen.In the interim, the patient has had a GYN exam which was within normal limits. She had the CT scan of the abdomen which showed three-vessel coronary artery disease and an ascending thoracic aortic aneurysm. The patient's daughter comes with her to the visit today. The patient also has hyperlipidemia and is currently not taking any medication for this. She is followed regularly at the [place].      Patient Active Problem List   Diagnosis Date Noted  . Inguinal adenopathy, left  09/20/2015  . 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 GERD (gastroesophageal reflux disease) 03/26/2013  . Statin intolerance 03/26/2013  . Oral bisphosphonates causing adverse effect in therapeutic use 03/26/2013  . HYPERCHOLESTEROLEMIA 08/06/2008  . HYPERTENSION 08/06/2008   Outpatient Encounter Prescriptions as of 09/28/2015  Medication Sig  . acetaminophen (TYLENOL) 325 MG suppository Place 325 mg rectally daily.  Marland Kitchen amLODipine-valsartan (EXFORGE) 5-320 MG per tablet Take 1 tablet by mouth daily.  Marland Kitchen azelastine (ASTELIN) 0.1 % nasal spray Place 1 spray into both nostrils 2 (two) times daily. Use in each nostril as directed  . Ergocalciferol (VITAMIN D2) 2000 units TABS Take 1 capsule by mouth daily.  . Omega-3 Fatty Acids (FISH OIL) 1000 MG CAPS Take 2 capsules by mouth daily.   No facility-administered encounter medications on file as of 09/28/2015.       Review of Systems  Constitutional: Negative.   HENT: Negative.   Eyes: Negative.   Respiratory: Negative.   Cardiovascular: Negative.   Gastrointestinal: Negative.   Endocrine: Negative.   Genitourinary: Negative.   Musculoskeletal: Negative.   Skin: Negative.   Allergic/Immunologic: Negative.   Neurological: Negative.   Hematological: Negative.     Psychiatric/Behavioral: Negative.        Objective:   Physical Exam BP 114/80 (BP Location: Left Arm)   Pulse 77   Temp 97.2 F (36.2 C) (Oral)   Ht 4\' 10"  (1.473 m)   Wt 131 lb (59.4 kg)   BMI 27.38 kg/m   The CT scan of the abdomen with this abnormalities were reviewed with the patient and her daughter and the results of this review is that the patient will have to be more aggressive with her diet and will have to do more to get her cholesterol down with medication. She will be scheduled for a visit with the clinical pharmacists to find the appropriate medication for treating her hyperlipidemia and her atherosclerotic heart disease. She does have an ascending aortic aneurysm that measures 4.2 cm. It was 3.9 cm in 2010. The radiologist recommends see me annual imaging and referral to cardiothoracic surgery which we will do and the patient and her daughter understand for further evaluation and follow-up. She will also be referred to the cardiologist because of left main and three-vessel coronary artery disease with aortic atherosclerosis. The patient and her daughter are aware of the findings in this report and agree for going to visit the cardiovascular surgeon the cardiologist in the clinical pharmacists.      Assessment & Plan:  1. Thoracic ascending aortic aneurysm [facility]) -Visit with cardiovascular surgeon to be scheduled  2. Thoracic aortic atherosclerosis ([place]) -Visit with cardiologist to be scheduled  3. HYPERCHOLESTEROLEMIA -Visit with clinical pharmacy to be scheduled  Patient Instructions  The patient will be scheduled  for visits with the clinical pharmacist to discuss hyperlipidemia and treatment The patient will be scheduled for a visit with the cardiovascular surgeon because of her ascending thoracic aortic aneurysm The patient will be scheduled for a visit with the cardiologist because of coronary artery disease from [place] scan  Arrie Senate MD

## 2015-09-28 NOTE — Patient Instructions (Signed)
The patient will be scheduled for visits with the clinical pharmacist to discuss hyperlipidemia and treatment The patient will be scheduled for a visit with the cardiovascular surgeon because of her ascending thoracic aortic aneurysm The patient will be scheduled for a visit with the cardiologist because of coronary artery disease from CT scan

## 2015-09-29 ENCOUNTER — Ambulatory Visit (HOSPITAL_COMMUNITY): Payer: Medicare Other

## 2015-10-06 ENCOUNTER — Institutional Professional Consult (permissible substitution) (INDEPENDENT_AMBULATORY_CARE_PROVIDER_SITE_OTHER): Payer: Medicare Other | Admitting: Surgery

## 2015-10-06 ENCOUNTER — Encounter: Payer: Self-pay | Admitting: Surgery

## 2015-10-06 VITALS — BP 137/90 | HR 72 | Resp 20 | Ht <= 58 in | Wt 130.0 lb

## 2015-10-06 DIAGNOSIS — I7121 Aneurysm of the ascending aorta, without rupture: Secondary | ICD-10-CM

## 2015-10-06 DIAGNOSIS — I712 Thoracic aortic aneurysm, without rupture: Secondary | ICD-10-CM

## 2015-10-06 NOTE — Progress Notes (Signed)
Cardiothoracic Surgery Consultation   PCP is Redge Gainer, MD Referring Provider is Chipper Herb, MD  Chief Complaint  Patient presents with  . 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 Scot Dock. CT of the abdomen at that time showed a 3.9 cm fusiform aortic root and proximal ascending aneurysm. She had a repeat abdominal CT done last month to evaluate two palpable knots in the left inguinal area. This showed no abnormality there and they were felt to be lymph nodes. The scan did show the aortic root and proximal ascending aorta which now measured 4.2 cm. She was also noted to have calcified plaque in the left main and all 3 coronary vessels. The suprarenal abdominal aorta measured 3.6 cm.    Past Medical History:  Diagnosis Date  . Allergy    allergic rhinitis  . AORTIC ANEURYSM 08/06/2008   Qualifier: Diagnosis of  By: Burnett Kanaris    . Arthritis   . Early cataracts, bilateral   . GERD (gastroesophageal reflux disease)   . Hyperlipidemia   . Hypertension   . Lordosis    sway back  . Melanoma in situ of neck 1990's  . Multiple gastric ulcers   . Osteoporosis   . Scoliosis    CHILDHOOD    Past Surgical History:  Procedure Laterality Date  . aneurysm surgery    . IDET Surgery     for broken back bones and sway back    Family History  Problem Relation Age of Onset  . Stroke Father   . Arthritis Father   . Hyperlipidemia Mother   . Stroke Mother   . Heart attack Brother   . COPD Brother   . Heart attack Brother   . Lung cancer Brother   . Kidney cancer Brother   . Colon polyps Brother   . Other Brother     asbestos poisoning    Social History Social History  Substance Use Topics  . Smoking status:  Former Smoker    Types: Cigarettes    Quit date: 03/06/1978  . Smokeless tobacco: Never Used  . Alcohol use No    Current Outpatient Prescriptions  Medication Sig Dispense Refill  . acetaminophen (TYLENOL) 325 MG suppository Place 325 mg rectally daily.    Marland Kitchen amLODipine-valsartan (EXFORGE) 5-320 MG per tablet Take 1 tablet by mouth daily. 30 tablet 3  . azelastine (ASTELIN) 0.1 % nasal spray Place 1 spray into both nostrils 2 (two) times daily. Use in each nostril as directed 30 mL 12  . Ergocalciferol (VITAMIN D2) 2000 units TABS Take 1 capsule by mouth daily.    . Omega-3 Fatty Acids (FISH OIL) 1000 MG CAPS Take 2 capsules by mouth daily.    . sertraline (ZOLOFT) 100 MG tablet Take 100 mg by mouth at bedtime.     No current facility-administered medications for this visit.     Allergies  Allergen Reactions  . Ivp Dye [Iodinated Diagnostic Agents] Hives  . Morphine And Related Other (See Comments)  . Aspirin     REACTION: Reaction not known  . Flexeril [Cyclobenzaprine] Other (See Comments)    Talking and walking in sleep  . Iodine     REACTION: Reaction not  known  . Nsaids     REACTION: Reaction not known  . Boniva [Ibandronic Acid] Nausea Only  . Fosamax [Alendronate Sodium] Nausea Only  . Livalo [Pitavastatin] Other (See Comments)    myalgias  . Simvastatin Other (See Comments)    myalgias    Review of Systems  Constitutional: Negative.   HENT: Negative.   Eyes: Negative.   Respiratory: Negative.   Cardiovascular: Negative for chest pain.  Gastrointestinal: Negative.   Endocrine: Negative.   Genitourinary: Negative.   Musculoskeletal: Negative.   Allergic/Immunologic: Negative.   Neurological: Positive for speech difficulty.       Difficulty thinking of words.  Hematological: Negative.   Psychiatric/Behavioral: Negative.     BP 137/90 (BP Location: Left Arm, Patient Position: Sitting, Cuff Size: Small)   Pulse 72   Resp 20   Ht 4\' 10"  (1.473 m)   Wt 130 lb  (59 kg)   SpO2 98% Comment: RA  BMI 27.17 kg/m  Physical Exam  Constitutional: She is oriented to person, place, and time.  Elderly thin woman in no distress.  HENT:  Head: Normocephalic and atraumatic.  Mouth/Throat: Oropharynx is clear and moist.  Eyes: EOM are normal. Pupils are equal, round, and reactive to light.  Neck: Normal range of motion. Neck supple. No JVD present. No thyromegaly present.  Cardiovascular: Normal rate, regular rhythm, normal heart sounds and intact distal pulses.   No murmur heard. Pulmonary/Chest: Effort normal and breath sounds normal. No respiratory distress. She has no rales.  Abdominal: Soft. Bowel sounds are normal. She exhibits no distension and no mass. There is no tenderness.  Musculoskeletal: Normal range of motion. She exhibits no edema.  Lymphadenopathy:    She has no cervical adenopathy.  Neurological: She is alert and oriented to person, place, and time. She has normal strength. No cranial nerve deficit or sensory deficit.  Difficulty thinking of words while answering questions. Seems to have good memory of events.  Skin: Skin is warm and dry.  Psychiatric: She has a normal mood and affect.     Diagnostic Tests:  CLINICAL DATA:  Palpable area over the left groin.  EXAM: CT ABDOMEN AND PELVIS WITHOUT CONTRAST  TECHNIQUE: Multidetector CT imaging of the abdomen and pelvis was performed following the standard protocol without IV contrast.  COMPARISON:  07/22/2008  FINDINGS: Lower chest: Atherosclerotic plaque over the thoracic aorta. Dilatation of the ascending thoracic aorta measuring 4.2 cm in AP diameter (3.9 cm in 2010). Calcified plaque over the left main and 3 vessel coronary arteries.  Hepatobiliary: Several small liver hypodensities with the largest measuring 1.4 cm compatible with cysts. Few calcified granulomas gallbladder somewhat contracted with suggestion of minimal cholelithiasis.  Pancreas: No mass or  inflammatory process identified on this un-enhanced exam.  Spleen: Within normal limits in size. Multiple calcified granulomas.  Adrenals/Urinary Tract: Adrenal glands are within normal. Kidneys are normal in size. There is a 1.3 cm cyst with minimal wall calcification over the upper pole left kidney without significant change. No nephrolithiasis or hydronephrosis. Ureters are within normal. Bladder is within normal.  Stomach/Bowel: Stomach and small bowel are within normal. The appendix is normal. There is moderate fecal retention contrast throughout the colon. No evidence of obstruction, inflammatory process, or abnormal fluid collections.  Vascular/Lymphatic: No pathologically enlarged lymph nodes. Calcification of the abdominal aorta and iliac arteries. Prominence of the suprarenal aorta measuring 3.6 cm in AP diameter. Evidence of patient's previous mid to distal abdominal aortic aneurysm repair.  Reproductive:  No mass or other significant abnormality.  Other: Small left periumbilical hernia containing only peritoneal fat. No significant left inguinal hernia, focal mass or adenopathy.  Musculoskeletal: There are degenerative changes of the spine and hips. Significant curvature of the lumbar spine convex left.  IMPRESSION: No acute findings in the abdomen/pelvis.  No focal mass, adenopathy or significant hernia over the left inguinal region.  Evidence of previous infrarenal abdominal aortic aneurysm repair. Dilatation of the ascending thoracic aorta measuring 4.2 cm (3.9 cm in 2010). Recommend semi-annual imaging followup by CTA or MRA and referral to cardiothoracic surgery if not already obtained. This recommendation follows 2010 ACCF/AHA/AATS/ACR/ASA/SCA/SCAI/SIR/STS/SVM Guidelines for the Diagnosis and Management of Patients With Thoracic Aortic Disease. Circulation. 2010; 121: e266-e36.  Left main and 3 vessel coronary artery disease.  Aortic atherosclerosis. Prominence of the suprarenal abdominal aorta measuring 3.6 cm in AP diameter.  Several small liver cysts. 1.3 cm minimally complicated left renal cyst without significant change.   Electronically Signed   By: Marin Olp M.D.   On: 09/21/2015 15:17  Impression:  I have personally reviewed her CT scan of the abdomen and have reviewed the films with her and her daughter. She has aneurysmal enlargement of the aortic root and proximal ascending aorta to 4.2 cm from 3.6 cm in 2010. This scan does not show most of the thoracic aorta. This is well below the threshold of 5.5 cm where surgical resection is recommended. I think it should be followed yearly with a CT of the chest without contrast since she has a contrast allergy. She is 75 years old and may have had a stroke or early dementia causing some difficulty with expressing words. She is scheduled for an MRI of the brain soon to evaluate this. If her neurologic status worsens over time and she is not a candidate for surgical repair if her aorta enlarges then I would stop following it with CT. I discussed my recommendations with her and her daughter and answered all of her questions. I discussed the importance of good blood pressure control and taking her medications as directed.  Plan:  Follow up in one year with a CT of the chest without contrast.  I spent 60 minutes performing this consultation and > 50% of this time was spent face to face counseling and coordinating the care of this patient's aortic root and ascending aortic aneurysm.  Gaye Pollack, MD [facility] [phone]

## 2015-10-07 ENCOUNTER — Ambulatory Visit (INDEPENDENT_AMBULATORY_CARE_PROVIDER_SITE_OTHER): Payer: Medicare Other | Admitting: Pharmacist

## 2015-10-07 VITALS — BP 120/82 | HR 68 | Ht <= 58 in | Wt 131.0 lb

## 2015-10-07 DIAGNOSIS — Z79899 Other long term (current) drug therapy: Secondary | ICD-10-CM | POA: Diagnosis not present

## 2015-10-07 DIAGNOSIS — E785 Hyperlipidemia, unspecified: Secondary | ICD-10-CM

## 2015-10-07 DIAGNOSIS — I10 Essential (primary) hypertension: Secondary | ICD-10-CM | POA: Diagnosis not present

## 2015-10-07 MED ORDER — EZETIMIBE 10 MG PO TABS: 10.0000 mg | ORAL_TABLET | Freq: Every day | ORAL | 4 refills | Status: DC

## 2015-10-07 NOTE — Progress Notes (Signed)
Patient ID: Tammy [name], female   DOB: 11/21/1940, 75 y.o.   MRN: [id number] Lipid Clinic Consultation  Chief Complaint:   Chief Complaint  Patient presents with  . Hyperlipidemia     HPI:  Patient was referred by PCP Dr Laurance Flatten to discuss hyperlipidemia.  I have seen Tammy [name] in the past and we have tried several statins with each time resulting in severe myalgias. Patient's last lipids panel in our office was 02/19/2014 and LDL was 227.  Though in the past she refused to try any other meds for hyperlipidemia today she is more open to discuss options (her daughter is present with her today)  She has a family history for stroke in both parents but both lived into their last 98's / early 63's.  She has an AAA which is followed at least yearly by Dr Cyndia Bent.  Also last ultra sound showed calcified plaque in coronary vessels.   Secondary cause of hyperlipidemia present:  none Low fat diet followed?  Yes - doesn't eat meat, limits dairy products.  Does not eat eggs or mayo  She like to have nutra grain type bar for breakfast, tomato sandwich,  Low carb diet followed?  Yes -   Exercise?  No -    Carotid Bruits:  none  Xanthomas:  Possible xanthomous tendons - but this has never been diagnosed in chart but patient mentions problems with hardening of tendons in her hands General Appearance:  alert, oriented, no acute distress and well nourished Mood/Affect:  normal  Patient also brings in her medications.  Reviewed bottles compared to our medications list.  Noted one inconsistancy - patient is taking valsartan 320mg  qd and our records had that she was taking exforge (valsartan + amlodipine) qd.  Called her pharmacy and they verified patient was getting valsartan and they thought this was because of cost.  Patient's last 2 BP checks have been at goal.   Assessment: Hyperlipidemia  Recommendations: Changes in lipid medication(s):  Add Zetia 10mg  1 tablet daily - if this does not get LDL to goal then  will again look into the possibility of Repantha or Praluent.    Discussed mediterranean type diet which patient is following similar diet. COntinue valsartan 320mg  - take 1 tablet daily - compliance enforced Encouraged restart daily exercise.   Recheck Lipid Panel:  2 months   Time spent counseling patient:  30 minutes   Referring Provider:  Laurance Flatten    PharmD:  Cherre Robins, PHARMD, CPP, CDE

## 2015-10-14 ENCOUNTER — Encounter: Payer: Self-pay | Admitting: Family Medicine

## 2015-10-14 ENCOUNTER — Ambulatory Visit (INDEPENDENT_AMBULATORY_CARE_PROVIDER_SITE_OTHER): Payer: Medicare Other | Admitting: Family Medicine

## 2015-10-14 VITALS — BP 115/84 | HR 77 | Temp 97.8°F | Ht <= 58 in | Wt 130.0 lb

## 2015-10-14 DIAGNOSIS — R490 Dysphonia: Secondary | ICD-10-CM

## 2015-10-14 DIAGNOSIS — I712 Thoracic aortic aneurysm, without rupture: Secondary | ICD-10-CM | POA: Diagnosis not present

## 2015-10-14 DIAGNOSIS — R413 Other amnesia: Secondary | ICD-10-CM

## 2015-10-14 DIAGNOSIS — F0391 Unspecified dementia with behavioral disturbance: Secondary | ICD-10-CM | POA: Diagnosis not present

## 2015-10-14 DIAGNOSIS — I700 Atherosclerosis of aorta: Secondary | ICD-10-CM | POA: Diagnosis not present

## 2015-10-14 DIAGNOSIS — E7800 Pure hypercholesterolemia, unspecified: Secondary | ICD-10-CM | POA: Diagnosis not present

## 2015-10-14 DIAGNOSIS — I7121 Aneurysm of the ascending aorta, without rupture: Secondary | ICD-10-CM

## 2015-10-14 NOTE — Patient Instructions (Signed)
[facility] and [facility] of the [facility] of the head and the CT scan of the throat They will need to address this for the patient's Court case that is pending They will also need to prescribe medication to help her be calmer and less reactive to her environment and to people The findings from the MRI could potentially result in canceling the Court case, the people at the [place] will have to help determine that. They will also have to write a letter supporting that with any information that they get from the MRI.

## 2015-10-14 NOTE — Progress Notes (Signed)
Subjective:    Patient ID: Tammy [name], female    DOB: 1940/11/09, 75 y.o.   MRN: [id number]  HPI Patient here today for 4 week follow up on confusion and depression. She is accompanied today by her daughter. The patient has been to see the vascular surgeon for the ascending thoracic aortic aneurysm and he plans to repeat the CT scan in one year. She has an upcoming appointment with the cardiologist because of the calcium buildup in her coronary arteries. She has been to the [place] and to the people at mental health there and they have ordered an MRI of the brain and a CT scan of the throat because of her hoarseness. The patient's daughter acknowledges that her confusion and behavior issues have increased significantly recently with her declining memory.     Patient Active Problem List   Diagnosis Date Noted  . Thoracic ascending aortic aneurysm ([place]) 09/28/2015  . Thoracic aortic atherosclerosis ([place]) 09/28/2015  . Inguinal adenopathy, left  09/20/2015  . 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 GERD (gastroesophageal reflux disease) 03/26/2013  . Statin intolerance 03/26/2013  . Oral bisphosphonates causing adverse effect in therapeutic use 03/26/2013  . HYPERCHOLESTEROLEMIA 08/06/2008  . HYPERTENSION 08/06/2008   Outpatient Encounter Prescriptions as of 10/14/2015  Medication Sig  . acetaminophen (TYLENOL) 325 MG suppository Place 325 mg rectally daily.  Marland Kitchen azelastine (ASTELIN) 0.1 % nasal spray Place 1 spray into both nostrils 2 (two) times daily. Use in each nostril as directed  . Ergocalciferol (VITAMIN D2) 2000 units TABS Take 1 capsule by mouth daily.  Marland Kitchen ezetimibe (ZETIA) 10 MG tablet Take 1 tablet (10 mg total) by mouth daily.  . Omega-3 Fatty Acids (FISH OIL) 1000 MG CAPS Take 2 capsules by mouth daily.  . sertraline (ZOLOFT) 100 MG tablet Take 100 mg by mouth at bedtime.  . valsartan (DIOVAN) 320 MG tablet Take 320 mg by mouth daily.   No  facility-administered encounter medications on file as of 10/14/2015.       Review of Systems  Constitutional: Negative.   HENT: Negative.   Eyes: Negative.   Respiratory: Negative.   Cardiovascular: Negative.   Gastrointestinal: Negative.   Endocrine: Negative.   Genitourinary: Negative.   Musculoskeletal: Negative.   Skin: Negative.   Allergic/Immunologic: Negative.   Neurological: Negative.   Hematological: Negative.   Psychiatric/Behavioral: Positive for confusion.       Objective:   Physical Exam BP 115/84 (BP Location: Right Arm)   Pulse 77   Temp 97.8 F (36.6 C) (Oral)   Ht 4\' 10"  (1.473 m)   Wt 130 lb (59 kg)   BMI 27.17 kg/m  This visit with the patient was primarily done reviewing the CT scan of the abdomen and pelvis and the results of the visit to the vascular surgeon regarding the ascending thoracic aortic aneurysm. As a result of the CT scan she also has a visit scheduled with the cardiologist and this was all cleared to the patient and her daughter. They have been to the [place] and to mental health. The daughter is very concerned because of her mother's behavior changes. She is concerned because of the increased confusion and memory issues. She understands that a lot of this could be coming from atherosclerosis affecting the arteries in her brain. The people at the [place] have ordered an MRI of the brain but the results are not available. They will be following up with the patient and her daughter as  to the findings from this MRI. They will also relayed the findings from the CT scan of the throat and her increasing hoarseness with follow-up for this as necessary. I have personally 9 this patient for many years and I can see a tremendous change in her recently. I did go for over a year without seeing her because she had been going to the [place].  Over 30 minutes of time was spent with this discussion with patient and her daughter     Assessment & Plan:  Marland Kitchen Memory  deficit -Follow-up with MRI at [facility] and possibly see a neurologist if necessary  2. HYPERCHOLESTEROLEMIA -The patient has a long history of hyperlipidemia but she is statin intolerant  3. Thoracic aortic atherosclerosis ([place]) -She will follow-up with the vascular surgeon in 1 year for a repeat CT scan of her chest  4. Ascending aortic aneurysm ([place]) -Follow-up with vascular surgeon in 1 year  5. Hoarseness -Follow-up at [facility] and get results of CT scan of the throat  6. Dementia, with behavioral disturbance -Follow-up with mental health at [facility] for management of dementia and behavioral issues as well as result of MRI of brain  Patient Instructions  [facility] the [facility] of the [facility] and the CT scan of the throat They will need to address this for the patient's Court case that is pending They will also need to prescribe medication to help her be calmer and less reactive to her environment and to people The findings from the MRI could potentially result in canceling the Court case, the people at the [place] will have to help determine that. They will also have to write a letter supporting that with any information that they get from the MRI.  Arrie Senate MD

## 2015-10-21 ENCOUNTER — Ambulatory Visit: Payer: Medicare Other | Admitting: Cardiovascular Disease

## 2015-10-21 ENCOUNTER — Ambulatory Visit: Payer: Medicare Other | Admitting: Internal Medicine
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
## 2015-11-02 ENCOUNTER — Encounter: Payer: Self-pay | Admitting: Cardiovascular Disease

## 2015-11-02 ENCOUNTER — Ambulatory Visit: Payer: Medicare Other | Admitting: Cardiovascular Disease

## 2015-11-02 ENCOUNTER — Ambulatory Visit (INDEPENDENT_AMBULATORY_CARE_PROVIDER_SITE_OTHER): Payer: Medicare Other | Admitting: Cardiovascular Disease

## 2015-11-02 VITALS — BP 122/78 | HR 69 | Ht 59.0 in | Wt 128.0 lb

## 2015-11-02 DIAGNOSIS — Z9889 Other specified postprocedural states: Secondary | ICD-10-CM | POA: Diagnosis not present

## 2015-11-02 DIAGNOSIS — I10 Essential (primary) hypertension: Secondary | ICD-10-CM | POA: Diagnosis not present

## 2015-11-02 DIAGNOSIS — Z8679 Personal history of other diseases of the circulatory system: Secondary | ICD-10-CM | POA: Diagnosis not present

## 2015-11-02 NOTE — Patient Instructions (Signed)
Your physician wants you to follow-up in: 6 months You will receive a reminder letter in the mail two months in advance. If you don't receive a letter, please call our office to schedule the follow-up appointment.   Your physician recommends that you continue on your current medications as directed. Please refer to the Current Medication list given to you today.  Your physician has requested that you have en exercise stress myoview. For further information please visit [email or web address]. Please follow instruction sheet, as given.   Thank you for choosing [facility] !

## 2015-11-15 ENCOUNTER — Encounter (HOSPITAL_COMMUNITY): Payer: Medicare Other

## 2015-11-15 ENCOUNTER — Inpatient Hospital Stay (HOSPITAL_COMMUNITY): Admission: RE | Admit: 2015-11-15 | Payer: Medicare Other | Source: Ambulatory Visit

## 2015-11-18 ENCOUNTER — Other Ambulatory Visit: Payer: Medicare Other

## 2015-11-19 ENCOUNTER — Other Ambulatory Visit: Payer: Medicare Other

## 2015-11-23 ENCOUNTER — Other Ambulatory Visit: Payer: Medicare Other

## 2015-11-23 DIAGNOSIS — E785 Hyperlipidemia, unspecified: Secondary | ICD-10-CM | POA: Diagnosis not present

## 2015-11-24 LAB — LIPID PANEL
CHOL/HDL RATIO: 5.2 ratio — AB (ref 0.0–4.4)
CHOLESTEROL TOTAL: 263 mg/dL — AB (ref 100–199)
HDL: 51 mg/dL (ref >39)
LDL CALC: 190 mg/dL — AB (ref 0–99)
TRIGLYCERIDES: 108 mg/dL (ref 0–149)
VLDL CHOLESTEROL CAL: 22 mg/dL (ref 5–40)

## 2015-11-24 LAB — HEPATIC FUNCTION PANEL
ALT: 12 IU/L (ref 0–32)
AST: 18 IU/L (ref 0–40)
Albumin: 4.4 g/dL (ref 3.5–4.8)
Alkaline Phosphatase: 63 IU/L (ref 39–117)
BILIRUBIN TOTAL: 0.5 mg/dL (ref 0.0–1.2)
Bilirubin, Direct: 0.12 mg/dL (ref 0.00–0.40)
Total Protein: 6.3 g/dL (ref 6.0–8.5)

## 2015-12-02 ENCOUNTER — Ambulatory Visit: Payer: Medicare Other | Admitting: Cardiovascular Disease

## 2016-08-26 ENCOUNTER — Encounter (HOSPITAL_COMMUNITY): Payer: Self-pay

## 2016-08-26 ENCOUNTER — Emergency Department (HOSPITAL_COMMUNITY): Payer: Medicare Other

## 2016-08-26 ENCOUNTER — Emergency Department (HOSPITAL_COMMUNITY)
Admission: EM | Admit: 2016-08-26 | Discharge: 2016-08-30 | Disposition: A | Discharge status: Other Acute Inpatient Hospital | Payer: Medicare Other | Attending: Emergency Medicine | Admitting: Emergency Medicine

## 2016-08-26 DIAGNOSIS — Z79899 Other long term (current) drug therapy: Secondary | ICD-10-CM | POA: Insufficient documentation

## 2016-08-26 DIAGNOSIS — Z87891 Personal history of nicotine dependence: Secondary | ICD-10-CM | POA: Insufficient documentation

## 2016-08-26 DIAGNOSIS — G3183 Dementia with Lewy bodies: Secondary | ICD-10-CM | POA: Diagnosis not present

## 2016-08-26 DIAGNOSIS — I10 Essential (primary) hypertension: Secondary | ICD-10-CM | POA: Diagnosis not present

## 2016-08-26 DIAGNOSIS — Z046 Encounter for general psychiatric examination, requested by authority: Secondary | ICD-10-CM | POA: Diagnosis present

## 2016-08-26 DIAGNOSIS — F0281 Dementia in other diseases classified elsewhere with behavioral disturbance: Secondary | ICD-10-CM

## 2016-08-26 DIAGNOSIS — F02818 Dementia in other diseases classified elsewhere, unspecified severity, with other behavioral disturbance: Secondary | ICD-10-CM

## 2016-08-26 DIAGNOSIS — R4182 Altered mental status, unspecified: Secondary | ICD-10-CM | POA: Insufficient documentation

## 2016-08-26 HISTORY — DX: Unspecified dementia, unspecified severity, without behavioral disturbance, psychotic disturbance, mood disturbance, and anxiety: F03.90

## 2016-08-26 LAB — COMPREHENSIVE METABOLIC PANEL
ALBUMIN: 4.4 g/dL (ref 3.5–5.0)
ALT: 19 U/L (ref 14–54)
AST: 22 U/L (ref 15–41)
Alkaline Phosphatase: 78 U/L (ref 38–126)
Anion gap: 8 (ref 5–15)
BUN: 14 mg/dL (ref 6–20)
CHLORIDE: 104 mmol/L (ref 101–111)
CO2: 28 mmol/L (ref 22–32)
CREATININE: 0.87 mg/dL (ref 0.44–1.00)
Calcium: 11.1 mg/dL — ABNORMAL HIGH (ref 8.9–10.3)
GFR calc Af Amer: >60 mL/min (ref >60)
GFR calc non Af Amer: >60 mL/min (ref >60)
Glucose, Bld: 97 mg/dL (ref 65–99)
Potassium: 4.1 mmol/L (ref 3.5–5.1)
SODIUM: 140 mmol/L (ref 135–145)
Total Bilirubin: 0.8 mg/dL (ref 0.3–1.2)
Total Protein: 7.1 g/dL (ref 6.5–8.1)

## 2016-08-26 LAB — URINALYSIS, ROUTINE W REFLEX MICROSCOPIC
Bacteria, UA: NONE SEEN
Bilirubin Urine: NEGATIVE
Glucose, UA: NEGATIVE mg/dL
Hgb urine dipstick: NEGATIVE
KETONES UR: NEGATIVE mg/dL
Leukocytes, UA: NEGATIVE
Nitrite: NEGATIVE
PH: 5 (ref 5.0–8.0)
Protein, ur: 30 mg/dL — AB
SPECIFIC GRAVITY, URINE: 1.02 (ref 1.005–1.030)
Squamous Epithelial / LPF: NONE SEEN

## 2016-08-26 LAB — RAPID URINE DRUG SCREEN, HOSP PERFORMED
AMPHETAMINES: NOT DETECTED
Barbiturates: NOT DETECTED
Benzodiazepines: NOT DETECTED
Cocaine: NOT DETECTED
Opiates: NOT DETECTED
Tetrahydrocannabinol: NOT DETECTED

## 2016-08-26 LAB — LIPASE, BLOOD: Lipase: 28 U/L (ref 11–51)

## 2016-08-26 LAB — ETHANOL

## 2016-08-26 MED ORDER — IRBESARTAN 300 MG PO TABS
300.0000 mg | ORAL_TABLET | Freq: Every day | ORAL | Status: DC
  Administered 2016-08-27 – 2016-08-30 (×3): 300 mg via ORAL
  Filled 2016-08-26 (×6): qty 1

## 2016-08-26 MED ORDER — ALUM & MAG HYDROXIDE-SIMETH 200-200-20 MG/5ML PO SUSP: 30.0000 mL | Freq: Four times a day (QID) | ORAL | Status: DC | PRN

## 2016-08-26 MED ORDER — ACETAMINOPHEN 325 MG PO TABS: 650.0000 mg | ORAL_TABLET | ORAL | Status: DC | PRN

## 2016-08-26 MED ORDER — SERTRALINE HCL 50 MG PO TABS
100.0000 mg | ORAL_TABLET | Freq: Every day | ORAL | Status: DC
  Administered 2016-08-27 – 2016-08-29 (×3): 100 mg via ORAL
  Filled 2016-08-26 (×5): qty 2

## 2016-08-26 NOTE — ED Notes (Addendum)
Pt has eaten small amount of vegetable lunch and is currently sitting quietly in a chair in her room- sitter nearby

## 2016-08-26 NOTE — ED Notes (Signed)
Continues in radiology 

## 2016-08-26 NOTE — ED Notes (Signed)
Pt is confused, pulling her bracelet, ambulates heel to toe to BR with assist from [place]

## 2016-08-26 NOTE — ED Notes (Signed)
Pt in paper scrubs and has been wanded

## 2016-08-26 NOTE — ED Provider Notes (Signed)
TTS has evaluated pt: pt does not meet inpt psych criteria at this time. No SW at [facility] on weekend. TTS will refer to their SW for placement. They will re-assess pt tomorrow. Family states they will check into [facility] for placement tomorrow. Pt will hold in ED overnight. Family agreeable with this plan.    Francine Graven, DO 08/26/16 1935
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
## 2016-08-26 NOTE — BHH Counselor (Addendum)
Clinician completed TTS assessment with patient. Clinician spoke to [name] [phone]) and [name] who provided collateral information.   Clinician consulted with Jinny Blossom, NP who states Pt does not meet criteria for inpatient admission. Patient not safe to return home at this time. Recommending SW consult for [name] [facility].  CSW contacted patient's nurse Lelon Frohlich, RN to inform of disposition.   Rigoberto Noel, MSW, LCSW TTS Clinician

## 2016-08-26 NOTE — ED Notes (Signed)
Call to [facility] to apprise

## 2016-08-26 NOTE — ED Notes (Signed)
Call to [facility] to apprise

## 2016-08-26 NOTE — ED Notes (Signed)
Family, Butch Penny, [place] as well as daughter at bedside- awaiting placement at this time
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
## 2016-08-26 NOTE — ED Notes (Addendum)
Call from [place], [facility] who reports pt does not meets criteria for admission and needs social work consult for placement
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
## 2016-08-26 NOTE — ED Notes (Signed)
Late entry: pt eats only vegetables and refused her supper of vegetables because per her reports they do not taste good  Offer of other foods:crackers, ice cream, cereal declined

## 2016-08-26 NOTE — ED Notes (Addendum)
Call from [facility] they cannot do eval until physician note is in place- Dr Z walking by desk informed and message from "Gene?" asking if TSS should be delayed  Until physician note is in place- Per Dr Earnest Conroy, note will be in place is "2 minutes"  Also request for family telephone number- pulled from pt record under snapshot and [phone]- However, pt family is awaiting dispo in the waiting room  [place] [phone]
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
## 2016-08-26 NOTE — ED Notes (Signed)
Call to [facility], [place] POC, TSS is referring pt to social work for placement- [facility] social worker will assume this pt and assist with placement-   Pt will be reassessed tomorrow

## 2016-08-26 NOTE — ED Notes (Signed)
TTS completed. 

## 2016-08-27 DIAGNOSIS — G3183 Dementia with Lewy bodies: Secondary | ICD-10-CM | POA: Diagnosis present

## 2016-08-27 DIAGNOSIS — Z87891 Personal history of nicotine dependence: Secondary | ICD-10-CM | POA: Diagnosis not present

## 2016-08-27 DIAGNOSIS — F0281 Dementia in other diseases classified elsewhere with behavioral disturbance: Secondary | ICD-10-CM

## 2016-08-27 NOTE — Progress Notes (Signed)
Patient awaiting social work placement. Patient does not meet inpatient psych treatment criteria.  This Probation officer contacted [facility] in [place] (this is a [facility] facility) and spoke with intake Diane Potle who advised to send patient's referral for the admissions director Council Mechanic to review tomorrow Monday 08/28/16.  Referral packet has been faxed to [place] [facility]" at fax#: [phone].  Verlon Setting, MSW, LCSWA Clinical Social Worker in Disposition [facility] TTS staff [phone] 08/27/2016 11:52 AM
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
## 2016-08-28 MED ORDER — OLANZAPINE 5 MG PO TABS
5.0000 mg | ORAL_TABLET | Freq: Two times a day (BID) | ORAL | Status: DC
  Administered 2016-08-28 – 2016-08-30 (×5): 5 mg via ORAL
  Filled 2016-08-28 (×5): qty 1

## 2016-08-28 MED ORDER — HALOPERIDOL LACTATE 5 MG/ML IJ SOLN
INTRAMUSCULAR | Status: AC
  Administered 2016-08-28: 2 mg via INTRAMUSCULAR
  Filled 2016-08-28: qty 1

## 2016-08-28 MED ORDER — MEMANTINE HCL 5 MG PO TABS
5.0000 mg | ORAL_TABLET | Freq: Every day | ORAL | Status: DC
  Administered 2016-08-28 – 2016-08-30 (×3): 5 mg via ORAL
  Filled 2016-08-28 (×5): qty 1

## 2016-08-28 MED ORDER — HALOPERIDOL LACTATE 5 MG/ML IJ SOLN
2.0000 mg | Freq: Once | INTRAMUSCULAR | Status: AC
  Administered 2016-08-28: 2 mg via INTRAMUSCULAR

## 2016-08-28 NOTE — ED Notes (Signed)
Pt has skin tear noted to left forearm area, area cleaned and bandage applied,

## 2016-08-28 NOTE — ED Notes (Signed)
Pt's daughter arrived to take patient home.  Pt became agitated, hostile and confused.  Daughter at bedside attempting to console pt.  EDP notified.  Social worker Vidal Schwalbe speaking with daughter.

## 2016-08-28 NOTE — ED Notes (Signed)
Pt continues to wander around in room, pt redirected several times, sitter at bedside,
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
## 2016-08-28 NOTE — ED Notes (Signed)
Pt continues to wander in room, is able to be redirected, sitter at bedside,

## 2016-08-28 NOTE — Discharge Instructions (Signed)
Follow up with out pt treatment

## 2016-08-28 NOTE — Clinical Social Work Note (Signed)
LCSW spoke with POA, Tammy [name].  She stated that she has been trying to get patient in to placement for a while, however she has been told that patient's monthly check is not enough to pay for placement and patient does not have Medicaid due to her monthly income being too much.  Ms. Tammy [name] stated that she was going to contact patient's daughter, Tammy [name], to come and get her. She stated that Tammy [name] will have to take patient home with her.  She stated that Tammy [name] was agreeable to taking patient home.   Patient goes to the [facility] in [place], [place]., She stated that she would re-contact them about placement. 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     Tammy [name], Tammy Pugh, LCSW

## 2016-08-28 NOTE — ED Notes (Signed)
Pt confused. Stated this nurse was the devil and a liar.

## 2016-08-28 NOTE — ED Notes (Signed)
Pt awake, wondering out to the hallway, pt redirected several times before she would return to her room, sitter remains at bedside,

## 2016-08-28 NOTE — ED Notes (Signed)
Pt sitting on stretcher, calmer, sitter remains at bedside,
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
## 2016-08-28 NOTE — ED Notes (Signed)
Pt walking around in room, remains confused, sitter at bedside, comfort measures provided,
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
## 2016-08-28 NOTE — ED Notes (Signed)
Pt lying on stretcher with eyes closed, resp even and non labored, sitter remains at bedside,

## 2016-08-28 NOTE — ED Notes (Signed)
Pt resting on stretcher, lying supine, resp even and non labored, sitter remains at bedside,

## 2016-08-28 NOTE — ED Notes (Signed)
Charge RN spoke with [facility] intake and notified of them of pt's behavior, [place] intake advised that pt would be changed to inpatient admission and bed placement was being sought,

## 2016-08-28 NOTE — ED Notes (Signed)
Daughter says POA Butch Penny O'dell) will visit pt sometime today.

## 2016-08-29 NOTE — ED Notes (Signed)
Meal at bedside at this time, pt sleeping.

## 2016-08-29 NOTE — Progress Notes (Signed)
Per Otila Kluver. O, NP patient is recommended for AM Psych evaluation.   Tiffany, RN notified.   Radonna Ricker MSW, [place] Disposition [phone]

## 2016-08-29 NOTE — ED Notes (Signed)
Pt clothing given to niece Erline Levine to take home and wash.

## 2016-08-29 NOTE — ED Notes (Signed)
Dr. Francine Graven from [facility] called to check on pt status.  Recommended to [facility] with [facility] to see if she could be admitted as an inpt psych there.

## 2016-08-29 NOTE — BH Assessment (Signed)
This clinician attempted to speak with the patient but she had slurred speech, was mumbling and appeared confused. She was not able to anwering any questions. Mainly stared at the computer screen and mumbling something in a low voice. She did not appear agitated or angry.

## 2016-08-29 NOTE — Progress Notes (Addendum)
CSW spoke with patient's niece/POA, Eloy End  [phone]) regarding possible D/C after TTS re-assessment.   Per Eloy End confirmed that she can pick patient up if recommended D/C and that patient has a bed ready at [facility] for further treatment.   CSW will follow up with [facility] to confirm bed.   Radonna Ricker MSW, [place] Disposition [phone]

## 2016-08-29 NOTE — Progress Notes (Addendum)
Per Charlean Merl, NP pt continues to meet inpt criteria. CSW spoke with Olivia Mackie, transfer coordinator with the [facility]. Olivia Mackie reports she will follow up with Dr. Francine Graven, MD because she reports they do not have a dementia unit that can accept the pt. Per chart, Dr. Francine Graven, MD reported the pt has a current bed at the [facility] and can be transferred. Olivia Mackie reports she will follow up with CSW to discuss if the pt has a potential bed at the [place]. Tracy's call back number is [phone].   Lind Covert, MSW, [place] Disposition [phone]

## 2016-08-29 NOTE — Progress Notes (Signed)
Patient is on the waitlist at [facility] per [place], and on the waitlist at [facility] today, on 6/26.  Writer attempted to contact Dr. Sheran Fava with [place] at [facility] [phone]) and left voicemail requesting call back regarding bed.  Per AP-ED RN Tiffany, patient would need to go directly to an inpatient psych unit due to EMTALA.  CSW in disposition to contact [place] to inquire if patient has a bed at their psych inpatient unit and if pt can go directly to their psych unit.  Referral has been also followed up at: [facility] - per [place], no beds. [facility] geriatric unit - at [place] - left voicemail. Per Pamala Hurry, 1 semi private female bed. [facility] - per Shirlee L[place]ick, no beds.  Declined at: [facility] - due to [place] - same as above [place] - same as above.  CSW in disposition will continue to follow up with placement efforts.  Verlon Setting, [place] Disposition staff 08/29/2016 5:40 PM

## 2016-08-29 NOTE — ED Notes (Signed)
Pt POA at bedside. RN updated niece on current treatment plan and informed her we are waiting for conformation of bed availability from [place] and [place].

## 2016-08-29 NOTE — BH Assessment (Addendum)
Phone call made to Dr. Sheran Fava with [place] at [facility] [phone] Ext [phone]). He stated that they do have a bed for patient once patient is medically cleared. He stated that patient can either be IVC'd or be brought in by family as they do no provide transportation. SW to contact family and verify if they can transport patient to the facility. Per Dr. Sheran Fava, patient is to go through the Emergency room on arrival prior to being transfered to the unit.  Addendum: Patient is expected to arrive tomorrow.

## 2016-08-29 NOTE — ED Notes (Signed)
Spoke to [place] at [facility] to let her know that Dr. Francine Graven advised inpt at the [facility] if possible. Otila Kluver was already aware of this and the [place] is full at this time.

## 2016-08-29 NOTE — Progress Notes (Signed)
CSW spoke with [place] regarding placement for patient.  They are requesting to speak with provider to discuss patient's presenting concerns.   Provider information was given to [place], NP and she agreed to contact [facility] provider to discuss patient.   CSW will continue to follow.   Radonna Ricker MSW, [place] Disposition [phone]

## 2016-08-30 MED ORDER — HALOPERIDOL LACTATE 5 MG/ML IJ SOLN
2.5000 mg | Freq: Once | INTRAMUSCULAR | Status: AC
  Administered 2016-08-30: 2.5 mg via INTRAMUSCULAR

## 2016-08-30 MED ORDER — LORAZEPAM 2 MG/ML IJ SOLN: 1.0000 mg | Freq: Once | INTRAMUSCULAR | Status: DC

## 2016-08-30 MED ORDER — HALOPERIDOL LACTATE 5 MG/ML IJ SOLN
INTRAMUSCULAR | Status: AC
  Filled 2016-08-30: qty 1

## 2016-08-30 NOTE — Progress Notes (Signed)
Received a phone call from Mila Doce with the [facility] who states she is unfamiliar with the pt but states she will follow up with Dr. Sheran Fava to discuss if the pt has a bed based on previous notes and Levada Dy states she will contact me back to discuss.  Lind Covert, MSW, [place] Disposition [phone]

## 2016-08-30 NOTE — Progress Notes (Signed)
CSW spoke with Olivia Mackie who states I will need to speak with care coordinator to discuss bed placement. CSW called [phone] and spoke with Darlene who stated Levada Dy will have to contact me back in order to discuss the possibility of placement. CSW to follow up.  Lind Covert, MSW, [place] Disposition [phone]

## 2016-08-30 NOTE — Progress Notes (Signed)
CSW received a voicemail from [place], Therapist, sports who states the EDP has agreed to rescind the IVC and d/c the pt to the care of her POA to transport her directly to the [place]. Per Hala, RN EDP reports she will rescind the IVC once the POA physically arrives to the ED to pick up the pt. Hala, RN states she will contact the POA to inform her that she can pick up the pt.   Dr. Sheran Fava [phone]) of the [facility] notified that the pt's POA has agreed to transport her directly to their facility.  Dr. Sheran Fava stated the POA should go to the [facility] through the "emergency department where she will be evaluated briefly and admitted to acute psych unit." Dr. Sheran Fava reported he will leave a note in the pt's chart and alert the ED that the pt will be coming.   Lind Covert, MSW, [place] Disposition [phone]

## 2016-08-30 NOTE — Progress Notes (Signed)
CSW followed up with Tammy [name] with the [facility] who stated Tammy [name] (intake coordinator) is not available however she was given the message to contact [facility] regarding possible admission of the pt and she will call back when available.   Tammy [name], MSW, [place] Disposition [phone]

## 2016-08-30 NOTE — ED Notes (Signed)
Pt very agitated and not staying in bed; orders given for Haldol and carried out

## 2016-08-30 NOTE — ED Notes (Signed)
T/c with Celene Kras, POA, she states she would like to have IVC paperwork resended so she can pick up pt and transport her to [facility] for placement; advised will contact [facility],   t/c to [place], [facility] with [facility], was advised a tentative bed placement had been discussed but no definitive placement at this time, she will contact facility to confirm placement and tentative plan is for the pt to be transported with IVC in place [place] state line and [facility] her the [place] of the way, she further states if IVC is resended, the pt may not meet criteria for the bed placement, she or Jarrett Soho will return call to advise   T/c to Celene Kras, discussed conversation with [facility], will call back once more info is rcvd

## 2016-08-30 NOTE — ED Notes (Signed)
Per EDP, Dr. Thurnell Garbe, she is willing to resend the IVC paperwork so POA can transport pt to facility, will resend upon arrival of POA  T/c to [place] with [facility] message left relaying info  T/c to Celene Kras, [place], relayed info regarding IVC, she states she will arrive to transport pt soon

## 2016-08-30 NOTE — ED Notes (Signed)
Pt escorted off unit at this time with POA and family, all pt belongings sent with pt, POA to transport pt to [facility] facility

## 2016-08-30 NOTE — Progress Notes (Signed)
CSW received a phone call from Dr. Sheran Fava who reports the [facility] is unable to accept a pt who is IVC'd. Dr. Sheran Fava discussed possible options to get the pt to [facility]. Per chart, the pt's POA Celene Kras has agreed to transport the pt directly to the [facility]. Dr. Sheran Fava reports if the pt's family transports her to the [facility], she will be assessed and admitted directly to their unit. Dr. Sheran Fava reports their unit does not typically take Dementia pt's, however this pt is currently "a case study" and Dr. Sheran Fava has agreed to admit the pt to their unit once she is evaluated in their ED.   CSW spoke with the pt's POA Celene Kras who states she would prefer the pt be d/c to her care and she has agreed to transport her directly to the [place].   CSW spoke with Hala, RN who states she will review with the EDP if the pt can be d/c to the care of her POA. CSW to follow up with Candelaria Stagers, RN.  Lind Covert, MSW, [place] Disposition [phone]

## 2016-08-30 NOTE — Progress Notes (Addendum)
LCSW working with [facility] Disposition regarding transfer to [place] reached out to legal counsel:  Lorelee New with [facility] Legal team regarding IVC and transport.  Options for transport:  1. Patient can transport by IVC under a Magistrate issued custody transport order (this is in addition to his current orders) to [place] or [facility] pending magistrate order.  Transport order is under mental health statue 122C.  Sheriff in [place] would coordinate with [place] in effort to arrange plans.  2. Janeece Riggers of Court for [employer] can issues and authorize patient to remain under IVC and be transported by authorized family member, friend or POA as Tourist information centre manager by Health visitor.  (This one may be difficult to arrange pending clerk of court response and assistance.  Patient must not be a danger to self, public, or family.  3.  If patient can be accepted voluntarily by facility and patient is not a danger to self, public or family, patient can be released of IVC if EDP is agreeable and transported by [place] (secured transport) to facility.    4. Lastly, if patient can be accepted voluntarily and family/ POA wants to transport and EDP agrees to release IVC, then patient can transport by Family, however this serves as a risk that patient may not make it up to hospital/inpatient psychiatric facility as this would be the family's responsibility.     LCSW will await to hear from [place] regarding  call back from [facility] in effort to coordinate, but give bed assignment and understand next steps. All information has been communicated to [facility] disposition SW.  Lane Hacker, MSW Clinical Social Work: System Wide Float Coverage for :  [phone]

## 2016-08-30 NOTE — Progress Notes (Signed)
CSW spoke with Olivia Mackie at [phone] ext [phone] and informed her that per NP note on 08/29/16, pt has a bed at [facility] and they are expecting the pt today however there is no accepting EDP or bed assignment. CSW discussed confirmation with Olivia Mackie to ensure the pt has a bed. Olivia Mackie to call back after speaking with Dr. Sheran Fava to confirm accepting information.  CSW spoke with [place] who states she will follow up to ensure the pt can be transported across state lines via sheriff transport due to IVC. CSW to follow up.  Lind Covert, MSW, [place] Disposition [phone]

## 2016-08-30 NOTE — ED Provider Notes (Signed)
Patient will be transported to [facility] for psychiatric care by her power of attorney. Excepting physician is Dr. Sheran Fava. They have requested IVC be rescinded. Family aware of this. IVC has been rescinded. They will take patient to the facility. Patient is stable for transport.   Fredia Sorrow, MD 08/30/16 1540

## 2016-09-21 ENCOUNTER — Other Ambulatory Visit: Payer: Self-pay | Admitting: *Deleted

## 2016-09-21 DIAGNOSIS — I712 Thoracic aortic aneurysm, without rupture, unspecified: Secondary | ICD-10-CM

## 2016-11-01 ENCOUNTER — Telehealth: Payer: Self-pay | Admitting: *Deleted

## 2016-11-01 ENCOUNTER — Ambulatory Visit: Payer: Medicare Other | Admitting: Surgery

## 2016-11-01 ENCOUNTER — Other Ambulatory Visit: Payer: Self-pay

## 2017-11-08 DIAGNOSIS — G309 Alzheimer's disease, unspecified: Secondary | ICD-10-CM | POA: Diagnosis not present

## 2017-11-08 DIAGNOSIS — E213 Hyperparathyroidism, unspecified: Secondary | ICD-10-CM | POA: Diagnosis not present

## 2017-11-08 DIAGNOSIS — F0151 Vascular dementia with behavioral disturbance: Secondary | ICD-10-CM | POA: Diagnosis not present

## 2017-11-08 DIAGNOSIS — M4015 Other secondary kyphosis, thoracolumbar region: Secondary | ICD-10-CM | POA: Diagnosis not present

## 2017-11-08 DIAGNOSIS — K219 Gastro-esophageal reflux disease without esophagitis: Secondary | ICD-10-CM | POA: Diagnosis not present

## 2017-11-08 DIAGNOSIS — M4155 Other secondary scoliosis, thoracolumbar region: Secondary | ICD-10-CM | POA: Diagnosis not present

## 2017-11-08 DIAGNOSIS — M810 Age-related osteoporosis without current pathological fracture: Secondary | ICD-10-CM | POA: Diagnosis not present

## 2017-11-10 DIAGNOSIS — S51011D Laceration without foreign body of right elbow, subsequent encounter: Secondary | ICD-10-CM | POA: Diagnosis not present

## 2017-11-10 DIAGNOSIS — E213 Hyperparathyroidism, unspecified: Secondary | ICD-10-CM | POA: Diagnosis not present

## 2017-11-10 DIAGNOSIS — K219 Gastro-esophageal reflux disease without esophagitis: Secondary | ICD-10-CM | POA: Diagnosis not present

## 2017-11-10 DIAGNOSIS — F0280 Dementia in other diseases classified elsewhere without behavioral disturbance: Secondary | ICD-10-CM | POA: Diagnosis not present

## 2017-11-10 DIAGNOSIS — Z9181 History of falling: Secondary | ICD-10-CM | POA: Diagnosis not present

## 2017-11-10 DIAGNOSIS — G309 Alzheimer's disease, unspecified: Secondary | ICD-10-CM | POA: Diagnosis not present

## 2017-11-10 DIAGNOSIS — S61412D Laceration without foreign body of left hand, subsequent encounter: Secondary | ICD-10-CM | POA: Diagnosis not present

## 2017-11-14 DIAGNOSIS — K219 Gastro-esophageal reflux disease without esophagitis: Secondary | ICD-10-CM | POA: Diagnosis not present

## 2017-11-14 DIAGNOSIS — S61412D Laceration without foreign body of left hand, subsequent encounter: Secondary | ICD-10-CM | POA: Diagnosis not present

## 2017-11-14 DIAGNOSIS — F0280 Dementia in other diseases classified elsewhere without behavioral disturbance: Secondary | ICD-10-CM | POA: Diagnosis not present

## 2017-11-14 DIAGNOSIS — G309 Alzheimer's disease, unspecified: Secondary | ICD-10-CM | POA: Diagnosis not present

## 2017-11-14 DIAGNOSIS — E213 Hyperparathyroidism, unspecified: Secondary | ICD-10-CM | POA: Diagnosis not present

## 2017-11-14 DIAGNOSIS — S51011D Laceration without foreign body of right elbow, subsequent encounter: Secondary | ICD-10-CM | POA: Diagnosis not present

## 2017-11-16 DIAGNOSIS — S61412D Laceration without foreign body of left hand, subsequent encounter: Secondary | ICD-10-CM | POA: Diagnosis not present

## 2017-11-16 DIAGNOSIS — F0280 Dementia in other diseases classified elsewhere without behavioral disturbance: Secondary | ICD-10-CM | POA: Diagnosis not present

## 2017-11-16 DIAGNOSIS — G309 Alzheimer's disease, unspecified: Secondary | ICD-10-CM | POA: Diagnosis not present

## 2017-11-16 DIAGNOSIS — S51011D Laceration without foreign body of right elbow, subsequent encounter: Secondary | ICD-10-CM | POA: Diagnosis not present

## 2017-11-16 DIAGNOSIS — K219 Gastro-esophageal reflux disease without esophagitis: Secondary | ICD-10-CM | POA: Diagnosis not present

## 2017-11-16 DIAGNOSIS — E213 Hyperparathyroidism, unspecified: Secondary | ICD-10-CM | POA: Diagnosis not present

## 2017-11-20 DIAGNOSIS — G309 Alzheimer's disease, unspecified: Secondary | ICD-10-CM | POA: Diagnosis not present

## 2017-11-20 DIAGNOSIS — S51011D Laceration without foreign body of right elbow, subsequent encounter: Secondary | ICD-10-CM | POA: Diagnosis not present

## 2017-11-20 DIAGNOSIS — K219 Gastro-esophageal reflux disease without esophagitis: Secondary | ICD-10-CM | POA: Diagnosis not present

## 2017-11-20 DIAGNOSIS — S61412D Laceration without foreign body of left hand, subsequent encounter: Secondary | ICD-10-CM | POA: Diagnosis not present

## 2017-11-20 DIAGNOSIS — F0280 Dementia in other diseases classified elsewhere without behavioral disturbance: Secondary | ICD-10-CM | POA: Diagnosis not present

## 2017-11-20 DIAGNOSIS — E213 Hyperparathyroidism, unspecified: Secondary | ICD-10-CM | POA: Diagnosis not present

## 2017-11-22 DIAGNOSIS — F0151 Vascular dementia with behavioral disturbance: Secondary | ICD-10-CM | POA: Diagnosis not present

## 2017-11-22 DIAGNOSIS — S61412A Laceration without foreign body of left hand, initial encounter: Secondary | ICD-10-CM | POA: Diagnosis not present

## 2017-11-22 DIAGNOSIS — G309 Alzheimer's disease, unspecified: Secondary | ICD-10-CM | POA: Diagnosis not present

## 2017-11-22 DIAGNOSIS — G4700 Insomnia, unspecified: Secondary | ICD-10-CM | POA: Diagnosis not present

## 2017-11-23 DIAGNOSIS — S61412D Laceration without foreign body of left hand, subsequent encounter: Secondary | ICD-10-CM | POA: Diagnosis not present

## 2017-11-23 DIAGNOSIS — S51011D Laceration without foreign body of right elbow, subsequent encounter: Secondary | ICD-10-CM | POA: Diagnosis not present

## 2017-11-23 DIAGNOSIS — G309 Alzheimer's disease, unspecified: Secondary | ICD-10-CM | POA: Diagnosis not present

## 2017-11-23 DIAGNOSIS — K219 Gastro-esophageal reflux disease without esophagitis: Secondary | ICD-10-CM | POA: Diagnosis not present

## 2017-11-23 DIAGNOSIS — F0280 Dementia in other diseases classified elsewhere without behavioral disturbance: Secondary | ICD-10-CM | POA: Diagnosis not present

## 2017-11-23 DIAGNOSIS — E213 Hyperparathyroidism, unspecified: Secondary | ICD-10-CM | POA: Diagnosis not present

## 2017-11-28 DIAGNOSIS — K219 Gastro-esophageal reflux disease without esophagitis: Secondary | ICD-10-CM | POA: Diagnosis not present

## 2017-11-28 DIAGNOSIS — E213 Hyperparathyroidism, unspecified: Secondary | ICD-10-CM | POA: Diagnosis not present

## 2017-11-28 DIAGNOSIS — F0280 Dementia in other diseases classified elsewhere without behavioral disturbance: Secondary | ICD-10-CM | POA: Diagnosis not present

## 2017-11-28 DIAGNOSIS — G309 Alzheimer's disease, unspecified: Secondary | ICD-10-CM | POA: Diagnosis not present

## 2017-11-28 DIAGNOSIS — S61412D Laceration without foreign body of left hand, subsequent encounter: Secondary | ICD-10-CM | POA: Diagnosis not present

## 2017-11-28 DIAGNOSIS — S51011D Laceration without foreign body of right elbow, subsequent encounter: Secondary | ICD-10-CM | POA: Diagnosis not present

## 2017-11-29 DIAGNOSIS — F0151 Vascular dementia with behavioral disturbance: Secondary | ICD-10-CM | POA: Diagnosis not present

## 2017-11-29 DIAGNOSIS — S0990XA Unspecified injury of head, initial encounter: Secondary | ICD-10-CM | POA: Diagnosis not present

## 2017-11-29 DIAGNOSIS — G309 Alzheimer's disease, unspecified: Secondary | ICD-10-CM | POA: Diagnosis not present

## 2017-11-29 DIAGNOSIS — W1830XA Fall on same level, unspecified, initial encounter: Secondary | ICD-10-CM | POA: Diagnosis not present

## 2017-11-29 DIAGNOSIS — Z049 Encounter for examination and observation for unspecified reason: Secondary | ICD-10-CM | POA: Diagnosis not present

## 2017-11-29 DIAGNOSIS — S4991XA Unspecified injury of right shoulder and upper arm, initial encounter: Secondary | ICD-10-CM | POA: Diagnosis not present

## 2017-11-29 DIAGNOSIS — G4700 Insomnia, unspecified: Secondary | ICD-10-CM | POA: Diagnosis not present

## 2017-11-29 DIAGNOSIS — S61412A Laceration without foreign body of left hand, initial encounter: Secondary | ICD-10-CM | POA: Diagnosis not present

## 2017-12-04 DIAGNOSIS — S51011D Laceration without foreign body of right elbow, subsequent encounter: Secondary | ICD-10-CM | POA: Diagnosis not present

## 2017-12-04 DIAGNOSIS — Z049 Encounter for examination and observation for unspecified reason: Secondary | ICD-10-CM | POA: Diagnosis not present

## 2017-12-04 DIAGNOSIS — K219 Gastro-esophageal reflux disease without esophagitis: Secondary | ICD-10-CM | POA: Diagnosis not present

## 2017-12-04 DIAGNOSIS — G309 Alzheimer's disease, unspecified: Secondary | ICD-10-CM | POA: Diagnosis not present

## 2017-12-04 DIAGNOSIS — F0280 Dementia in other diseases classified elsewhere without behavioral disturbance: Secondary | ICD-10-CM | POA: Diagnosis not present

## 2017-12-04 DIAGNOSIS — K5909 Other constipation: Secondary | ICD-10-CM | POA: Diagnosis not present

## 2017-12-04 DIAGNOSIS — E860 Dehydration: Secondary | ICD-10-CM | POA: Diagnosis not present

## 2017-12-04 DIAGNOSIS — S61412D Laceration without foreign body of left hand, subsequent encounter: Secondary | ICD-10-CM | POA: Diagnosis not present

## 2017-12-04 DIAGNOSIS — E213 Hyperparathyroidism, unspecified: Secondary | ICD-10-CM | POA: Diagnosis not present

## 2017-12-05 DIAGNOSIS — R32 Unspecified urinary incontinence: Secondary | ICD-10-CM | POA: Diagnosis not present

## 2017-12-05 DIAGNOSIS — Z66 Do not resuscitate: Secondary | ICD-10-CM | POA: Diagnosis present

## 2017-12-05 DIAGNOSIS — E876 Hypokalemia: Secondary | ICD-10-CM | POA: Diagnosis not present

## 2017-12-05 DIAGNOSIS — K55029 Acute infarction of small intestine, extent unspecified: Secondary | ICD-10-CM | POA: Diagnosis not present

## 2017-12-05 DIAGNOSIS — K559 Vascular disorder of intestine, unspecified: Secondary | ICD-10-CM | POA: Diagnosis not present

## 2017-12-05 DIAGNOSIS — R296 Repeated falls: Secondary | ICD-10-CM | POA: Diagnosis present

## 2017-12-05 DIAGNOSIS — K5900 Constipation, unspecified: Secondary | ICD-10-CM | POA: Diagnosis present

## 2017-12-05 DIAGNOSIS — Z79899 Other long term (current) drug therapy: Secondary | ICD-10-CM | POA: Diagnosis not present

## 2017-12-05 DIAGNOSIS — D1330 Benign neoplasm of unspecified part of small intestine: Secondary | ICD-10-CM | POA: Diagnosis not present

## 2017-12-05 DIAGNOSIS — R109 Unspecified abdominal pain: Secondary | ICD-10-CM | POA: Diagnosis not present

## 2017-12-05 DIAGNOSIS — G309 Alzheimer's disease, unspecified: Secondary | ICD-10-CM | POA: Diagnosis present

## 2017-12-05 DIAGNOSIS — F0280 Dementia in other diseases classified elsewhere without behavioral disturbance: Secondary | ICD-10-CM | POA: Diagnosis present

## 2017-12-05 DIAGNOSIS — K4130 Unilateral femoral hernia, with obstruction, without gangrene, not specified as recurrent: Secondary | ICD-10-CM | POA: Diagnosis present

## 2017-12-13 DIAGNOSIS — K4190 Unilateral femoral hernia, without obstruction or gangrene, not specified as recurrent: Secondary | ICD-10-CM | POA: Diagnosis not present

## 2017-12-13 DIAGNOSIS — G309 Alzheimer's disease, unspecified: Secondary | ICD-10-CM | POA: Diagnosis not present

## 2017-12-13 DIAGNOSIS — G4700 Insomnia, unspecified: Secondary | ICD-10-CM | POA: Diagnosis not present

## 2017-12-13 DIAGNOSIS — S61412A Laceration without foreign body of left hand, initial encounter: Secondary | ICD-10-CM | POA: Diagnosis not present

## 2017-12-13 DIAGNOSIS — Z48815 Encounter for surgical aftercare following surgery on the digestive system: Secondary | ICD-10-CM | POA: Diagnosis not present

## 2017-12-13 DIAGNOSIS — Z7689 Persons encountering health services in other specified circumstances: Secondary | ICD-10-CM | POA: Diagnosis not present

## 2017-12-13 DIAGNOSIS — F0151 Vascular dementia with behavioral disturbance: Secondary | ICD-10-CM | POA: Diagnosis not present

## 2017-12-14 DIAGNOSIS — D649 Anemia, unspecified: Secondary | ICD-10-CM | POA: Diagnosis not present

## 2017-12-14 DIAGNOSIS — Z888 Allergy status to other drugs, medicaments and biological substances status: Secondary | ICD-10-CM | POA: Diagnosis not present

## 2017-12-14 DIAGNOSIS — R279 Unspecified lack of coordination: Secondary | ICD-10-CM | POA: Diagnosis not present

## 2017-12-14 DIAGNOSIS — W19XXXA Unspecified fall, initial encounter: Secondary | ICD-10-CM | POA: Diagnosis not present

## 2017-12-14 DIAGNOSIS — K219 Gastro-esophageal reflux disease without esophagitis: Secondary | ICD-10-CM | POA: Diagnosis not present

## 2017-12-14 DIAGNOSIS — F0280 Dementia in other diseases classified elsewhere without behavioral disturbance: Secondary | ICD-10-CM | POA: Diagnosis not present

## 2017-12-14 DIAGNOSIS — S50312A Abrasion of left elbow, initial encounter: Secondary | ICD-10-CM | POA: Diagnosis not present

## 2017-12-14 DIAGNOSIS — S51011D Laceration without foreign body of right elbow, subsequent encounter: Secondary | ICD-10-CM | POA: Diagnosis not present

## 2017-12-14 DIAGNOSIS — M7989 Other specified soft tissue disorders: Secondary | ICD-10-CM | POA: Diagnosis not present

## 2017-12-14 DIAGNOSIS — Z049 Encounter for examination and observation for unspecified reason: Secondary | ICD-10-CM | POA: Diagnosis not present

## 2017-12-14 DIAGNOSIS — Z743 Need for continuous supervision: Secondary | ICD-10-CM | POA: Diagnosis not present

## 2017-12-14 DIAGNOSIS — Z791 Long term (current) use of non-steroidal anti-inflammatories (NSAID): Secondary | ICD-10-CM | POA: Diagnosis not present

## 2017-12-14 DIAGNOSIS — Z886 Allergy status to analgesic agent status: Secondary | ICD-10-CM | POA: Diagnosis not present

## 2017-12-14 DIAGNOSIS — S61412D Laceration without foreign body of left hand, subsequent encounter: Secondary | ICD-10-CM | POA: Diagnosis not present

## 2017-12-14 DIAGNOSIS — G9389 Other specified disorders of brain: Secondary | ICD-10-CM | POA: Diagnosis not present

## 2017-12-14 DIAGNOSIS — R404 Transient alteration of awareness: Secondary | ICD-10-CM | POA: Diagnosis not present

## 2017-12-14 DIAGNOSIS — Z79899 Other long term (current) drug therapy: Secondary | ICD-10-CM | POA: Diagnosis not present

## 2017-12-14 DIAGNOSIS — M25552 Pain in left hip: Secondary | ICD-10-CM | POA: Diagnosis not present

## 2017-12-14 DIAGNOSIS — G309 Alzheimer's disease, unspecified: Secondary | ICD-10-CM | POA: Diagnosis not present

## 2017-12-14 DIAGNOSIS — W1830XA Fall on same level, unspecified, initial encounter: Secondary | ICD-10-CM | POA: Diagnosis not present

## 2017-12-14 DIAGNOSIS — S59901A Unspecified injury of right elbow, initial encounter: Secondary | ICD-10-CM | POA: Diagnosis not present

## 2017-12-14 DIAGNOSIS — S59902A Unspecified injury of left elbow, initial encounter: Secondary | ICD-10-CM | POA: Diagnosis not present

## 2017-12-14 DIAGNOSIS — S50319A Abrasion of unspecified elbow, initial encounter: Secondary | ICD-10-CM | POA: Diagnosis not present

## 2017-12-14 DIAGNOSIS — E213 Hyperparathyroidism, unspecified: Secondary | ICD-10-CM | POA: Diagnosis not present

## 2017-12-18 DIAGNOSIS — G309 Alzheimer's disease, unspecified: Secondary | ICD-10-CM | POA: Diagnosis not present

## 2017-12-18 DIAGNOSIS — S50319A Abrasion of unspecified elbow, initial encounter: Secondary | ICD-10-CM | POA: Diagnosis not present

## 2017-12-18 DIAGNOSIS — F0151 Vascular dementia with behavioral disturbance: Secondary | ICD-10-CM | POA: Diagnosis not present

## 2017-12-18 DIAGNOSIS — Z7689 Persons encountering health services in other specified circumstances: Secondary | ICD-10-CM | POA: Diagnosis not present

## 2017-12-18 DIAGNOSIS — R296 Repeated falls: Secondary | ICD-10-CM | POA: Diagnosis not present

## 2017-12-18 DIAGNOSIS — R229 Localized swelling, mass and lump, unspecified: Secondary | ICD-10-CM | POA: Diagnosis not present

## 2017-12-21 DIAGNOSIS — G309 Alzheimer's disease, unspecified: Secondary | ICD-10-CM | POA: Diagnosis not present

## 2017-12-21 DIAGNOSIS — Z09 Encounter for follow-up examination after completed treatment for conditions other than malignant neoplasm: Secondary | ICD-10-CM | POA: Diagnosis not present

## 2017-12-21 DIAGNOSIS — B351 Tinea unguium: Secondary | ICD-10-CM | POA: Diagnosis not present

## 2017-12-21 DIAGNOSIS — T148XXA Other injury of unspecified body region, initial encounter: Secondary | ICD-10-CM | POA: Diagnosis not present

## 2017-12-21 DIAGNOSIS — Q845 Enlarged and hypertrophic nails: Secondary | ICD-10-CM | POA: Diagnosis not present

## 2017-12-21 DIAGNOSIS — G609 Hereditary and idiopathic neuropathy, unspecified: Secondary | ICD-10-CM | POA: Diagnosis not present

## 2017-12-21 DIAGNOSIS — F0280 Dementia in other diseases classified elsewhere without behavioral disturbance: Secondary | ICD-10-CM | POA: Diagnosis not present

## 2017-12-21 DIAGNOSIS — S51011D Laceration without foreign body of right elbow, subsequent encounter: Secondary | ICD-10-CM | POA: Diagnosis not present

## 2017-12-21 DIAGNOSIS — S61412D Laceration without foreign body of left hand, subsequent encounter: Secondary | ICD-10-CM | POA: Diagnosis not present

## 2017-12-21 DIAGNOSIS — L089 Local infection of the skin and subcutaneous tissue, unspecified: Secondary | ICD-10-CM | POA: Diagnosis not present

## 2017-12-21 DIAGNOSIS — K219 Gastro-esophageal reflux disease without esophagitis: Secondary | ICD-10-CM | POA: Diagnosis not present

## 2017-12-21 DIAGNOSIS — E213 Hyperparathyroidism, unspecified: Secondary | ICD-10-CM | POA: Diagnosis not present

## 2017-12-21 DIAGNOSIS — L603 Nail dystrophy: Secondary | ICD-10-CM | POA: Diagnosis not present

## 2017-12-26 DIAGNOSIS — F0280 Dementia in other diseases classified elsewhere without behavioral disturbance: Secondary | ICD-10-CM | POA: Diagnosis not present

## 2017-12-26 DIAGNOSIS — K219 Gastro-esophageal reflux disease without esophagitis: Secondary | ICD-10-CM | POA: Diagnosis not present

## 2017-12-26 DIAGNOSIS — S61412D Laceration without foreign body of left hand, subsequent encounter: Secondary | ICD-10-CM | POA: Diagnosis not present

## 2017-12-26 DIAGNOSIS — S51011D Laceration without foreign body of right elbow, subsequent encounter: Secondary | ICD-10-CM | POA: Diagnosis not present

## 2017-12-26 DIAGNOSIS — G309 Alzheimer's disease, unspecified: Secondary | ICD-10-CM | POA: Diagnosis not present

## 2017-12-26 DIAGNOSIS — E213 Hyperparathyroidism, unspecified: Secondary | ICD-10-CM | POA: Diagnosis not present

## 2017-12-31 DIAGNOSIS — Z9889 Other specified postprocedural states: Secondary | ICD-10-CM | POA: Diagnosis not present

## 2018-01-01 DIAGNOSIS — G4701 Insomnia due to medical condition: Secondary | ICD-10-CM | POA: Diagnosis not present

## 2018-01-01 DIAGNOSIS — F0281 Dementia in other diseases classified elsewhere with behavioral disturbance: Secondary | ICD-10-CM | POA: Diagnosis not present

## 2018-01-01 DIAGNOSIS — G300 Alzheimer's disease with early onset: Secondary | ICD-10-CM | POA: Diagnosis not present

## 2018-01-02 DIAGNOSIS — F0280 Dementia in other diseases classified elsewhere without behavioral disturbance: Secondary | ICD-10-CM | POA: Diagnosis not present

## 2018-01-02 DIAGNOSIS — S61412D Laceration without foreign body of left hand, subsequent encounter: Secondary | ICD-10-CM | POA: Diagnosis not present

## 2018-01-02 DIAGNOSIS — E213 Hyperparathyroidism, unspecified: Secondary | ICD-10-CM | POA: Diagnosis not present

## 2018-01-02 DIAGNOSIS — K219 Gastro-esophageal reflux disease without esophagitis: Secondary | ICD-10-CM | POA: Diagnosis not present

## 2018-01-02 DIAGNOSIS — S51011D Laceration without foreign body of right elbow, subsequent encounter: Secondary | ICD-10-CM | POA: Diagnosis not present

## 2018-01-02 DIAGNOSIS — G309 Alzheimer's disease, unspecified: Secondary | ICD-10-CM | POA: Diagnosis not present

## 2018-01-07 DIAGNOSIS — F0280 Dementia in other diseases classified elsewhere without behavioral disturbance: Secondary | ICD-10-CM | POA: Diagnosis not present

## 2018-01-07 DIAGNOSIS — G309 Alzheimer's disease, unspecified: Secondary | ICD-10-CM | POA: Diagnosis not present

## 2018-01-07 DIAGNOSIS — E213 Hyperparathyroidism, unspecified: Secondary | ICD-10-CM | POA: Diagnosis not present

## 2018-01-07 DIAGNOSIS — S51011D Laceration without foreign body of right elbow, subsequent encounter: Secondary | ICD-10-CM | POA: Diagnosis not present

## 2018-01-07 DIAGNOSIS — K219 Gastro-esophageal reflux disease without esophagitis: Secondary | ICD-10-CM | POA: Diagnosis not present

## 2018-01-07 DIAGNOSIS — S61412D Laceration without foreign body of left hand, subsequent encounter: Secondary | ICD-10-CM | POA: Diagnosis not present

## 2018-01-09 DIAGNOSIS — Z23 Encounter for immunization: Secondary | ICD-10-CM | POA: Diagnosis not present

## 2018-01-10 DIAGNOSIS — Z91041 Radiographic dye allergy status: Secondary | ICD-10-CM | POA: Diagnosis not present

## 2018-01-10 DIAGNOSIS — R403 Persistent vegetative state: Secondary | ICD-10-CM | POA: Diagnosis not present

## 2018-01-10 DIAGNOSIS — Z888 Allergy status to other drugs, medicaments and biological substances status: Secondary | ICD-10-CM | POA: Diagnosis not present

## 2018-01-10 DIAGNOSIS — R0902 Hypoxemia: Secondary | ICD-10-CM | POA: Diagnosis not present

## 2018-01-10 DIAGNOSIS — F0280 Dementia in other diseases classified elsewhere without behavioral disturbance: Secondary | ICD-10-CM | POA: Diagnosis not present

## 2018-01-10 DIAGNOSIS — S50312A Abrasion of left elbow, initial encounter: Secondary | ICD-10-CM | POA: Diagnosis not present

## 2018-01-10 DIAGNOSIS — G301 Alzheimer's disease with late onset: Secondary | ICD-10-CM | POA: Diagnosis not present

## 2018-01-10 DIAGNOSIS — Z87891 Personal history of nicotine dependence: Secondary | ICD-10-CM | POA: Diagnosis not present

## 2018-01-10 DIAGNOSIS — Z886 Allergy status to analgesic agent status: Secondary | ICD-10-CM | POA: Diagnosis not present

## 2018-01-10 DIAGNOSIS — Z79899 Other long term (current) drug therapy: Secondary | ICD-10-CM | POA: Diagnosis not present

## 2018-01-10 DIAGNOSIS — S50311A Abrasion of right elbow, initial encounter: Secondary | ICD-10-CM | POA: Diagnosis not present

## 2018-01-10 DIAGNOSIS — G309 Alzheimer's disease, unspecified: Secondary | ICD-10-CM | POA: Diagnosis not present

## 2018-01-10 DIAGNOSIS — E860 Dehydration: Secondary | ICD-10-CM | POA: Diagnosis not present

## 2018-01-10 DIAGNOSIS — F0151 Vascular dementia with behavioral disturbance: Secondary | ICD-10-CM | POA: Diagnosis not present

## 2018-01-10 DIAGNOSIS — F0390 Unspecified dementia without behavioral disturbance: Secondary | ICD-10-CM | POA: Diagnosis not present

## 2018-01-10 DIAGNOSIS — R4182 Altered mental status, unspecified: Secondary | ICD-10-CM | POA: Diagnosis not present

## 2018-01-16 DIAGNOSIS — F0280 Dementia in other diseases classified elsewhere without behavioral disturbance: Secondary | ICD-10-CM | POA: Diagnosis not present

## 2018-01-16 DIAGNOSIS — G301 Alzheimer's disease with late onset: Secondary | ICD-10-CM | POA: Diagnosis not present

## 2018-01-16 DIAGNOSIS — M4000 Postural kyphosis, site unspecified: Secondary | ICD-10-CM | POA: Diagnosis not present

## 2018-01-18 DIAGNOSIS — M4000 Postural kyphosis, site unspecified: Secondary | ICD-10-CM | POA: Diagnosis not present

## 2018-01-18 DIAGNOSIS — G301 Alzheimer's disease with late onset: Secondary | ICD-10-CM | POA: Diagnosis not present

## 2018-01-18 DIAGNOSIS — F0280 Dementia in other diseases classified elsewhere without behavioral disturbance: Secondary | ICD-10-CM | POA: Diagnosis not present

## 2018-01-21 DIAGNOSIS — M4000 Postural kyphosis, site unspecified: Secondary | ICD-10-CM | POA: Diagnosis not present

## 2018-01-21 DIAGNOSIS — G301 Alzheimer's disease with late onset: Secondary | ICD-10-CM | POA: Diagnosis not present

## 2018-01-21 DIAGNOSIS — F0280 Dementia in other diseases classified elsewhere without behavioral disturbance: Secondary | ICD-10-CM | POA: Diagnosis not present

## 2018-01-22 DIAGNOSIS — F0280 Dementia in other diseases classified elsewhere without behavioral disturbance: Secondary | ICD-10-CM | POA: Diagnosis not present

## 2018-01-22 DIAGNOSIS — M4000 Postural kyphosis, site unspecified: Secondary | ICD-10-CM | POA: Diagnosis not present

## 2018-01-22 DIAGNOSIS — G301 Alzheimer's disease with late onset: Secondary | ICD-10-CM | POA: Diagnosis not present

## 2018-01-23 DIAGNOSIS — G301 Alzheimer's disease with late onset: Secondary | ICD-10-CM | POA: Diagnosis not present

## 2018-01-23 DIAGNOSIS — M4000 Postural kyphosis, site unspecified: Secondary | ICD-10-CM | POA: Diagnosis not present

## 2018-01-23 DIAGNOSIS — F0280 Dementia in other diseases classified elsewhere without behavioral disturbance: Secondary | ICD-10-CM | POA: Diagnosis not present

## 2018-01-24 DIAGNOSIS — F0280 Dementia in other diseases classified elsewhere without behavioral disturbance: Secondary | ICD-10-CM | POA: Diagnosis not present

## 2018-01-24 DIAGNOSIS — M4000 Postural kyphosis, site unspecified: Secondary | ICD-10-CM | POA: Diagnosis not present

## 2018-01-24 DIAGNOSIS — G301 Alzheimer's disease with late onset: Secondary | ICD-10-CM | POA: Diagnosis not present

## 2018-01-25 DIAGNOSIS — G301 Alzheimer's disease with late onset: Secondary | ICD-10-CM | POA: Diagnosis not present

## 2018-01-25 DIAGNOSIS — M4000 Postural kyphosis, site unspecified: Secondary | ICD-10-CM | POA: Diagnosis not present

## 2018-01-25 DIAGNOSIS — F0280 Dementia in other diseases classified elsewhere without behavioral disturbance: Secondary | ICD-10-CM | POA: Diagnosis not present

## 2018-01-26 DIAGNOSIS — M4000 Postural kyphosis, site unspecified: Secondary | ICD-10-CM | POA: Diagnosis not present

## 2018-01-26 DIAGNOSIS — G301 Alzheimer's disease with late onset: Secondary | ICD-10-CM | POA: Diagnosis not present

## 2018-01-26 DIAGNOSIS — F0280 Dementia in other diseases classified elsewhere without behavioral disturbance: Secondary | ICD-10-CM | POA: Diagnosis not present

## 2018-02-03 DEATH — deceased

## 2018-04-08 IMAGING — CT CT HEAD W/O CM
3 of 4 series · 15 of 47 positions shown, 18 images · non-contrast
Comparison: None available

CLINICAL DATA: Altered mental status, combative, dementia

EXAM:
CT HEAD WITHOUT CONTRAST
TECHNIQUE: Contiguous axial images were obtained from the base of the skull
through the vertex without intravenous contrast.

[Series 2: head wo · axial · 0.47mm/px · z∈[+1655,+1770]mm · 9 of 29 slices shown, 12 images]
[im 3/29  brain]
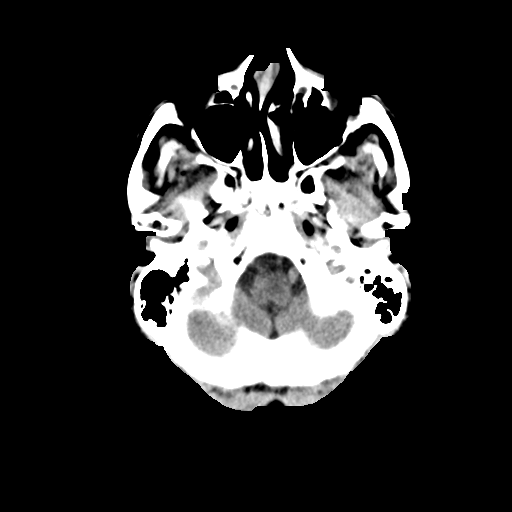
[im 3/29  bone]
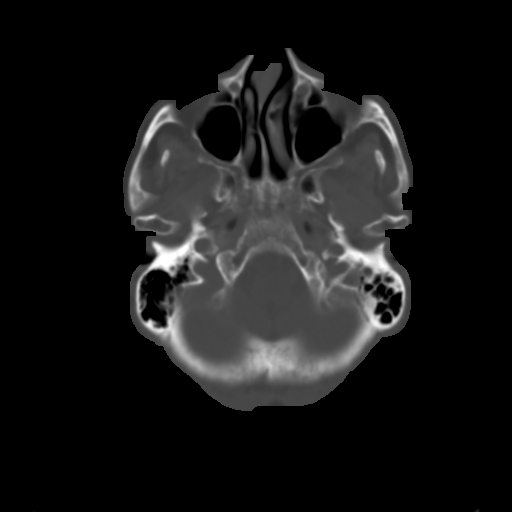
[im 7/29  brain]
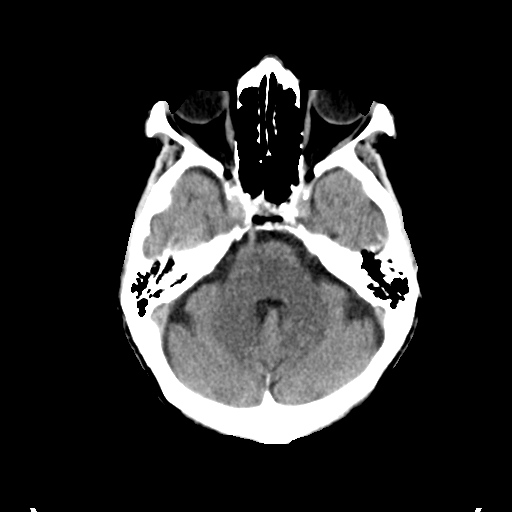
[im 9/29  brain]
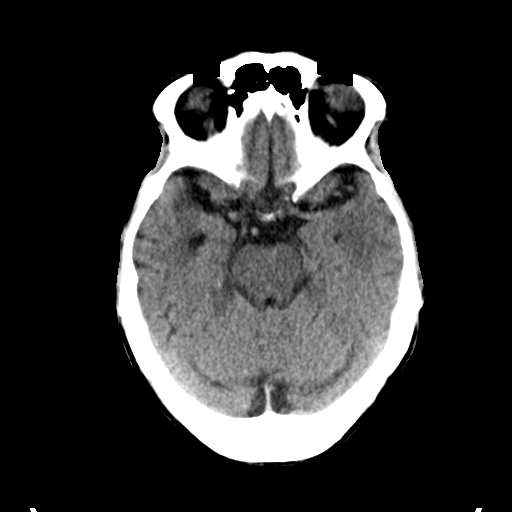
[im 11/29  brain]
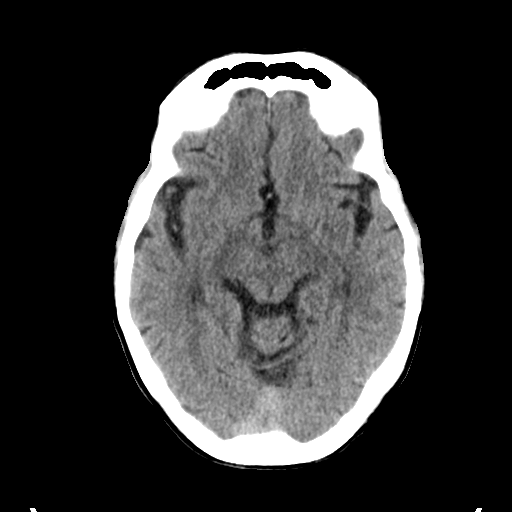
[im 16/29  brain]
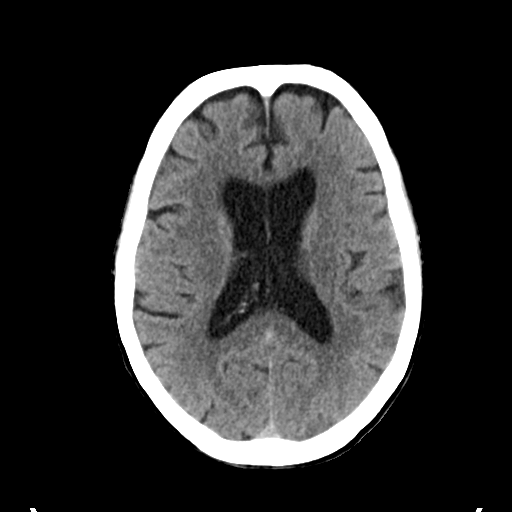
[im 16/29  bone]
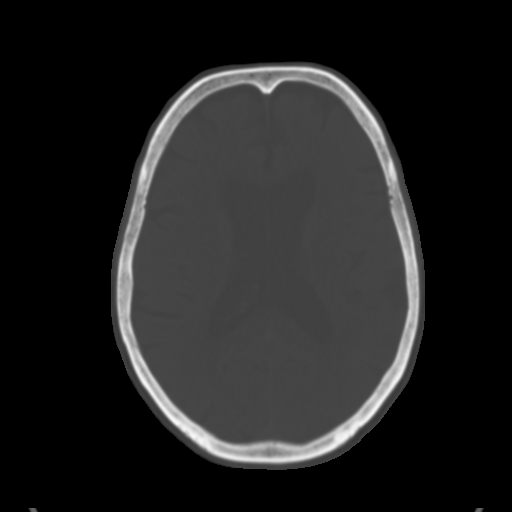
[im 18/29  brain]
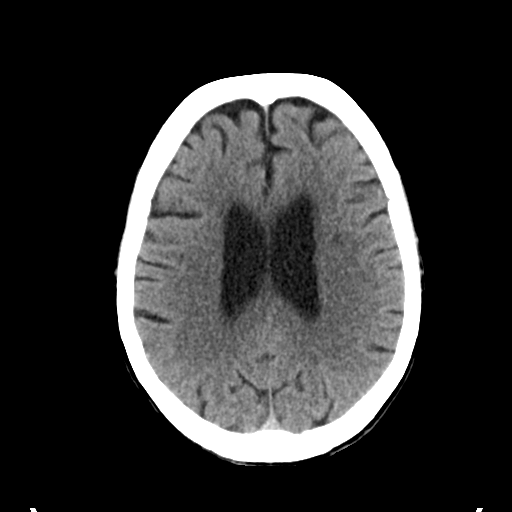
[im 20/29  brain]
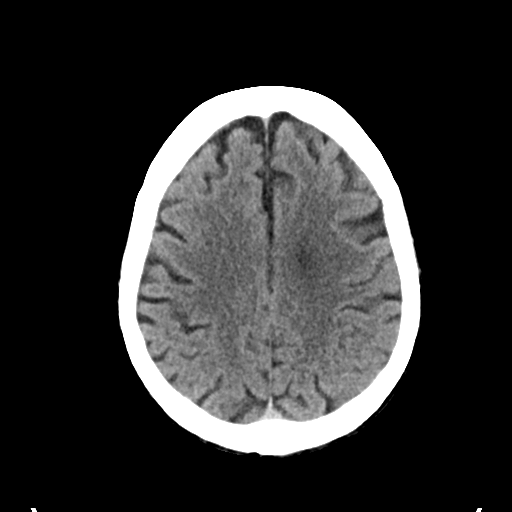
[im 24/29  brain]
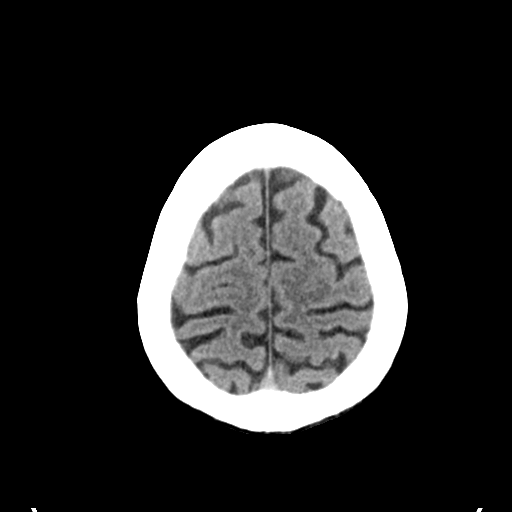
[im 26/29  brain]
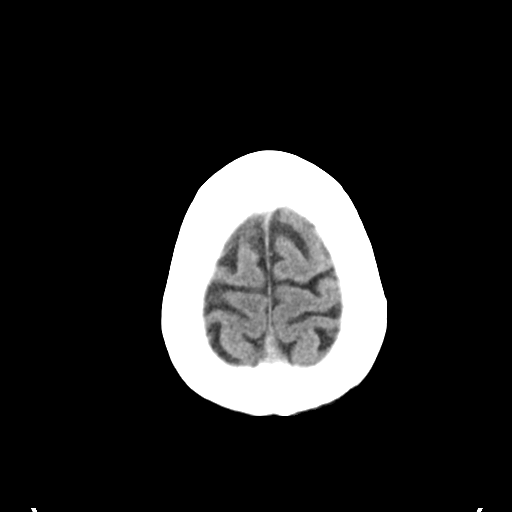
[im 26/29  bone]
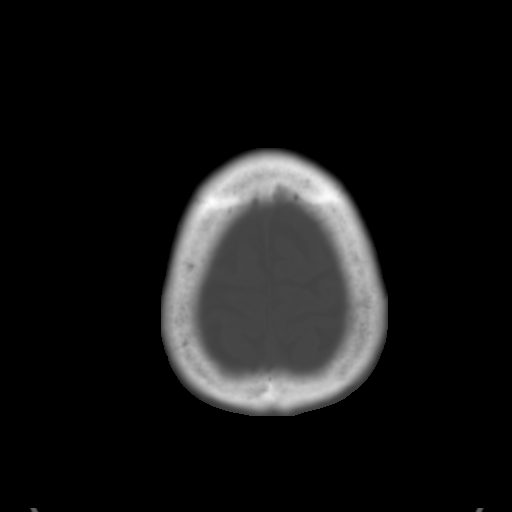

[Series 4: coronal soft tissue · coronal · 0.38mm/px · 3 of 67 slices shown]
[im 23/67  brain]
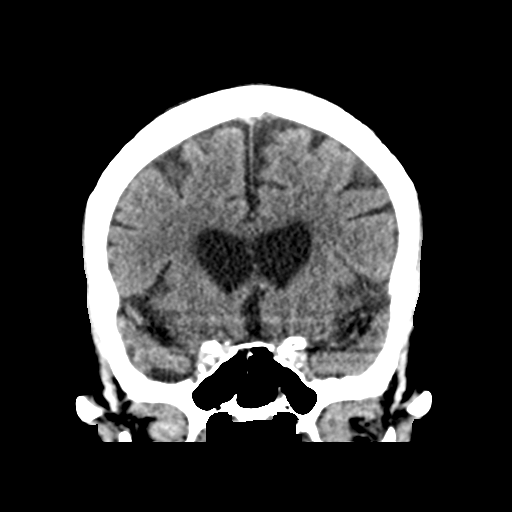
[im 30/67  brain]
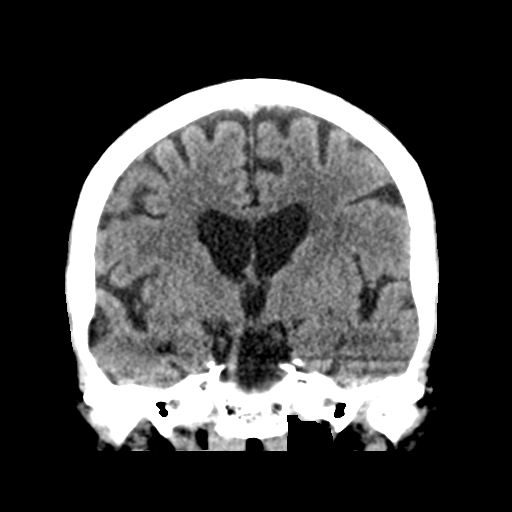
[im 37/67  brain]
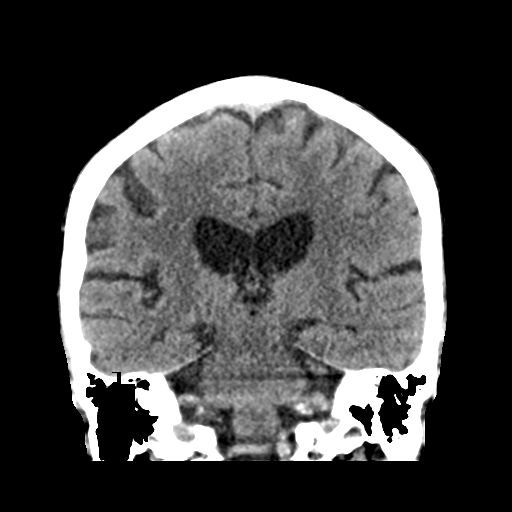

[Series 5: sagittal soft tissue · sagittal · 0.35mm/px · 3 of 67 slices shown]
[im 23/67  brain]
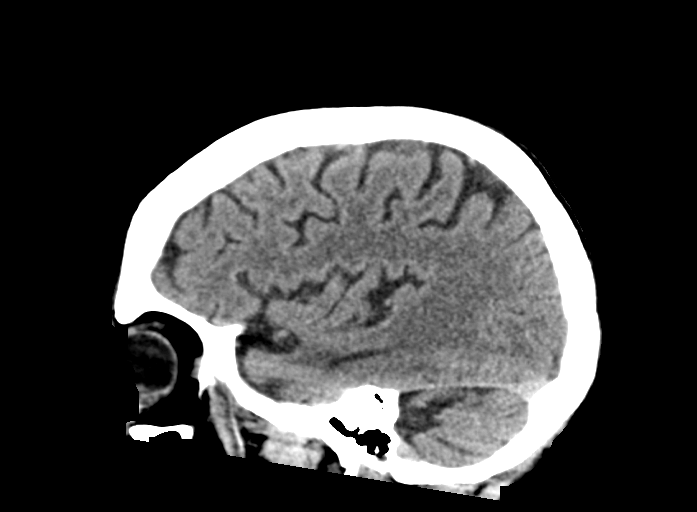
[im 34/67  brain]
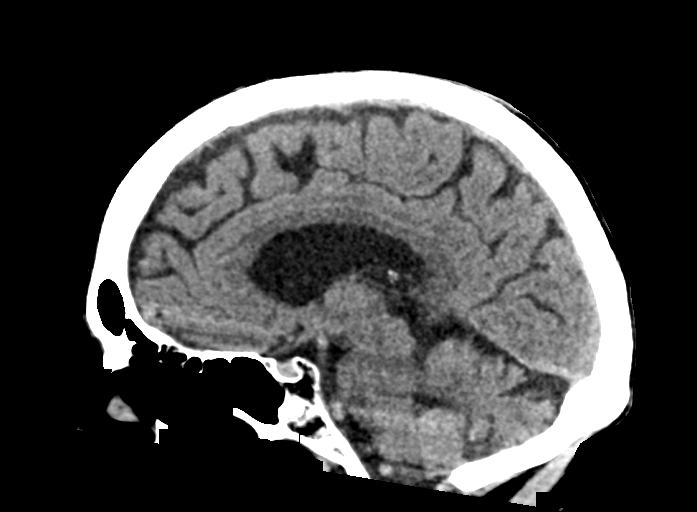
[im 45/67  brain]
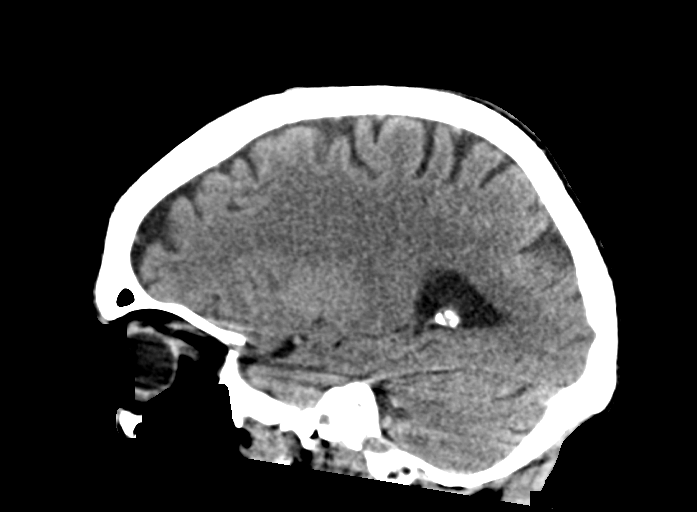

[15 of 47 positions shown; findings below may reference images not displayed]

FINDINGS: Brain: Stable brain atrophy pattern and chronic white matter
microvascular ischemic changes throughout both cerebral hemispheres.
Mild associated ventricular enlargement. No acute intracranial
hemorrhage, mass lesion, midline shift acute infarction herniation,
hydrocephalus, or extra-axial fluid collection. Normal gray-white
matter differentiation. Cisterns are patent. Cerebellar atrophy as
well.

Vascular: No hyperdense vessel or unexpected calcification.

Skull: Normal. Negative for fracture or focal lesion.

Sinuses/Orbits: No acute finding.

Other: None.
IMPRESSION: Brain atrophy and chronic white matter microvascular ischemic
changes.

No acute intracranial abnormality by noncontrast CT.

## 2018-04-08 IMAGING — DX DG CHEST 2V
2 series · 2 of 2 positions shown · non-contrast
Comparison: February 16, 2014

CLINICAL DATA: Acute mental status change. History of aortic
aneurysm.

EXAM:
CHEST  2 VIEW

[chest lat]
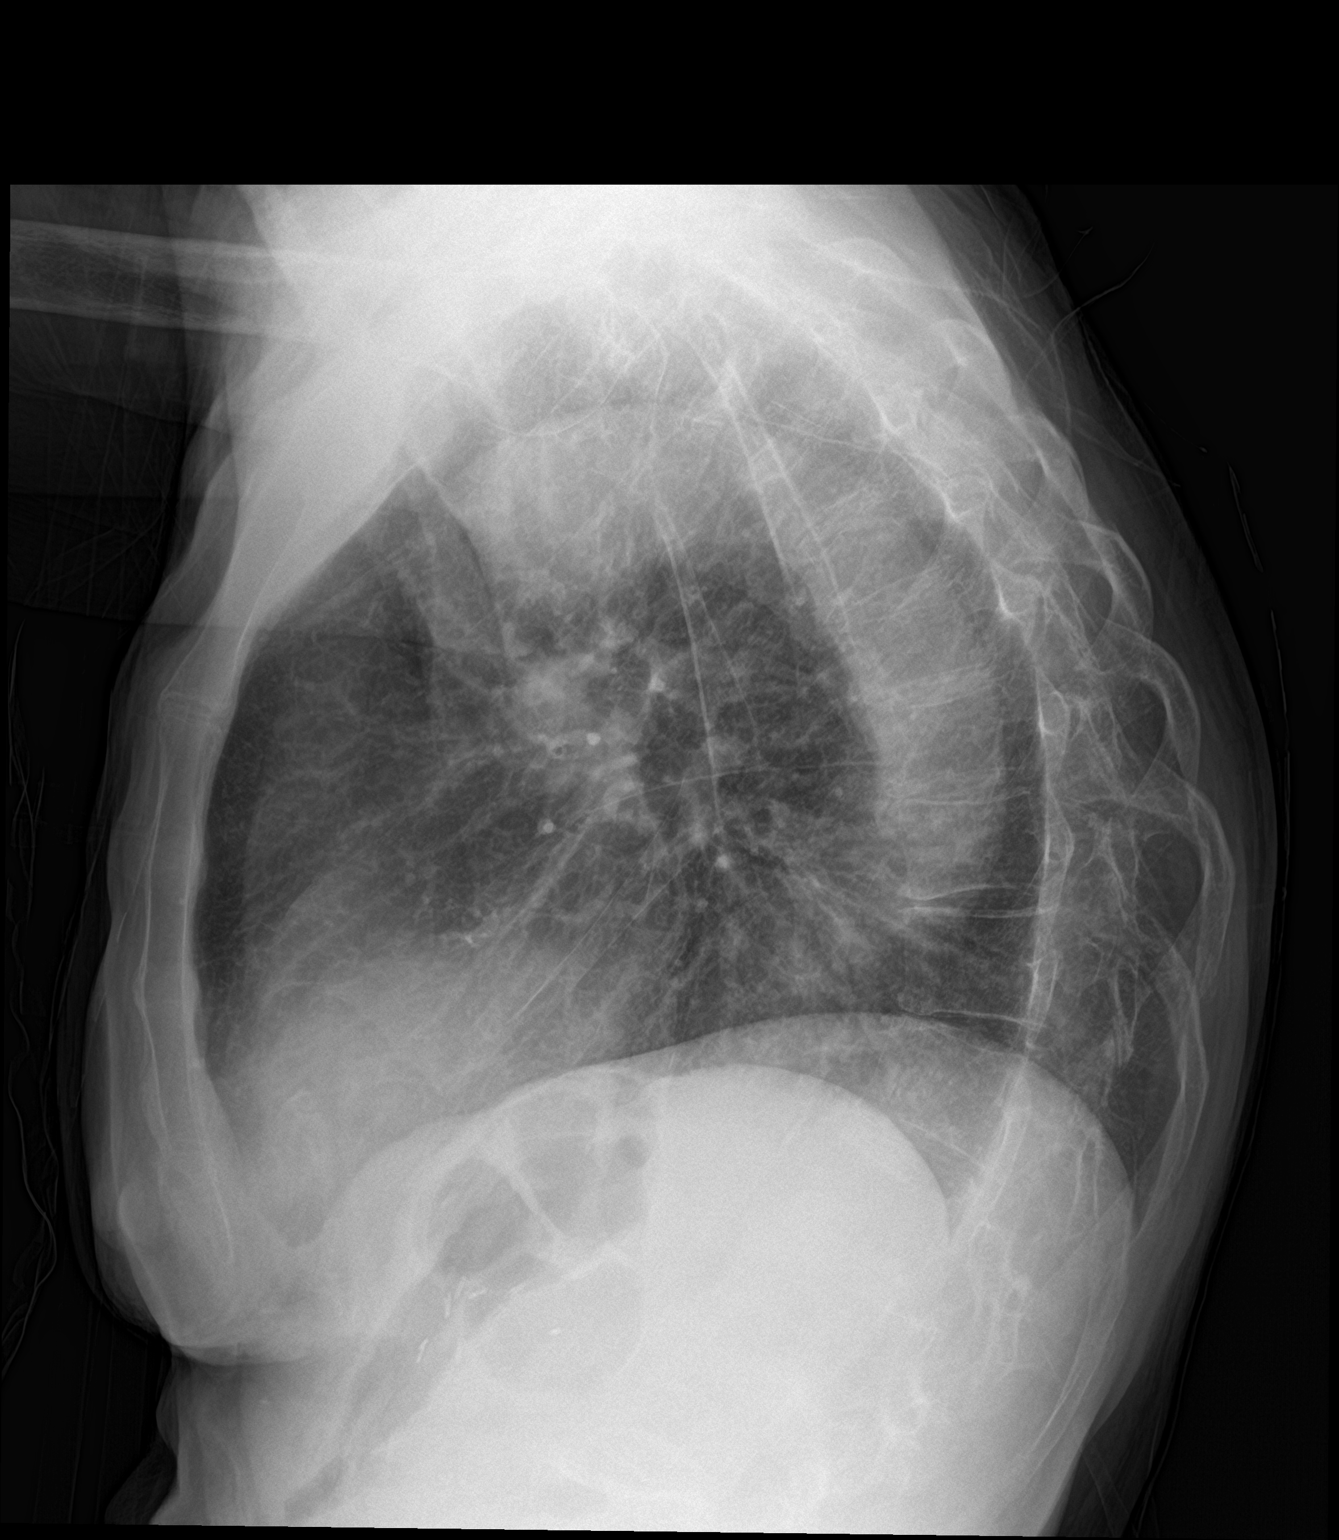

[chest pa]
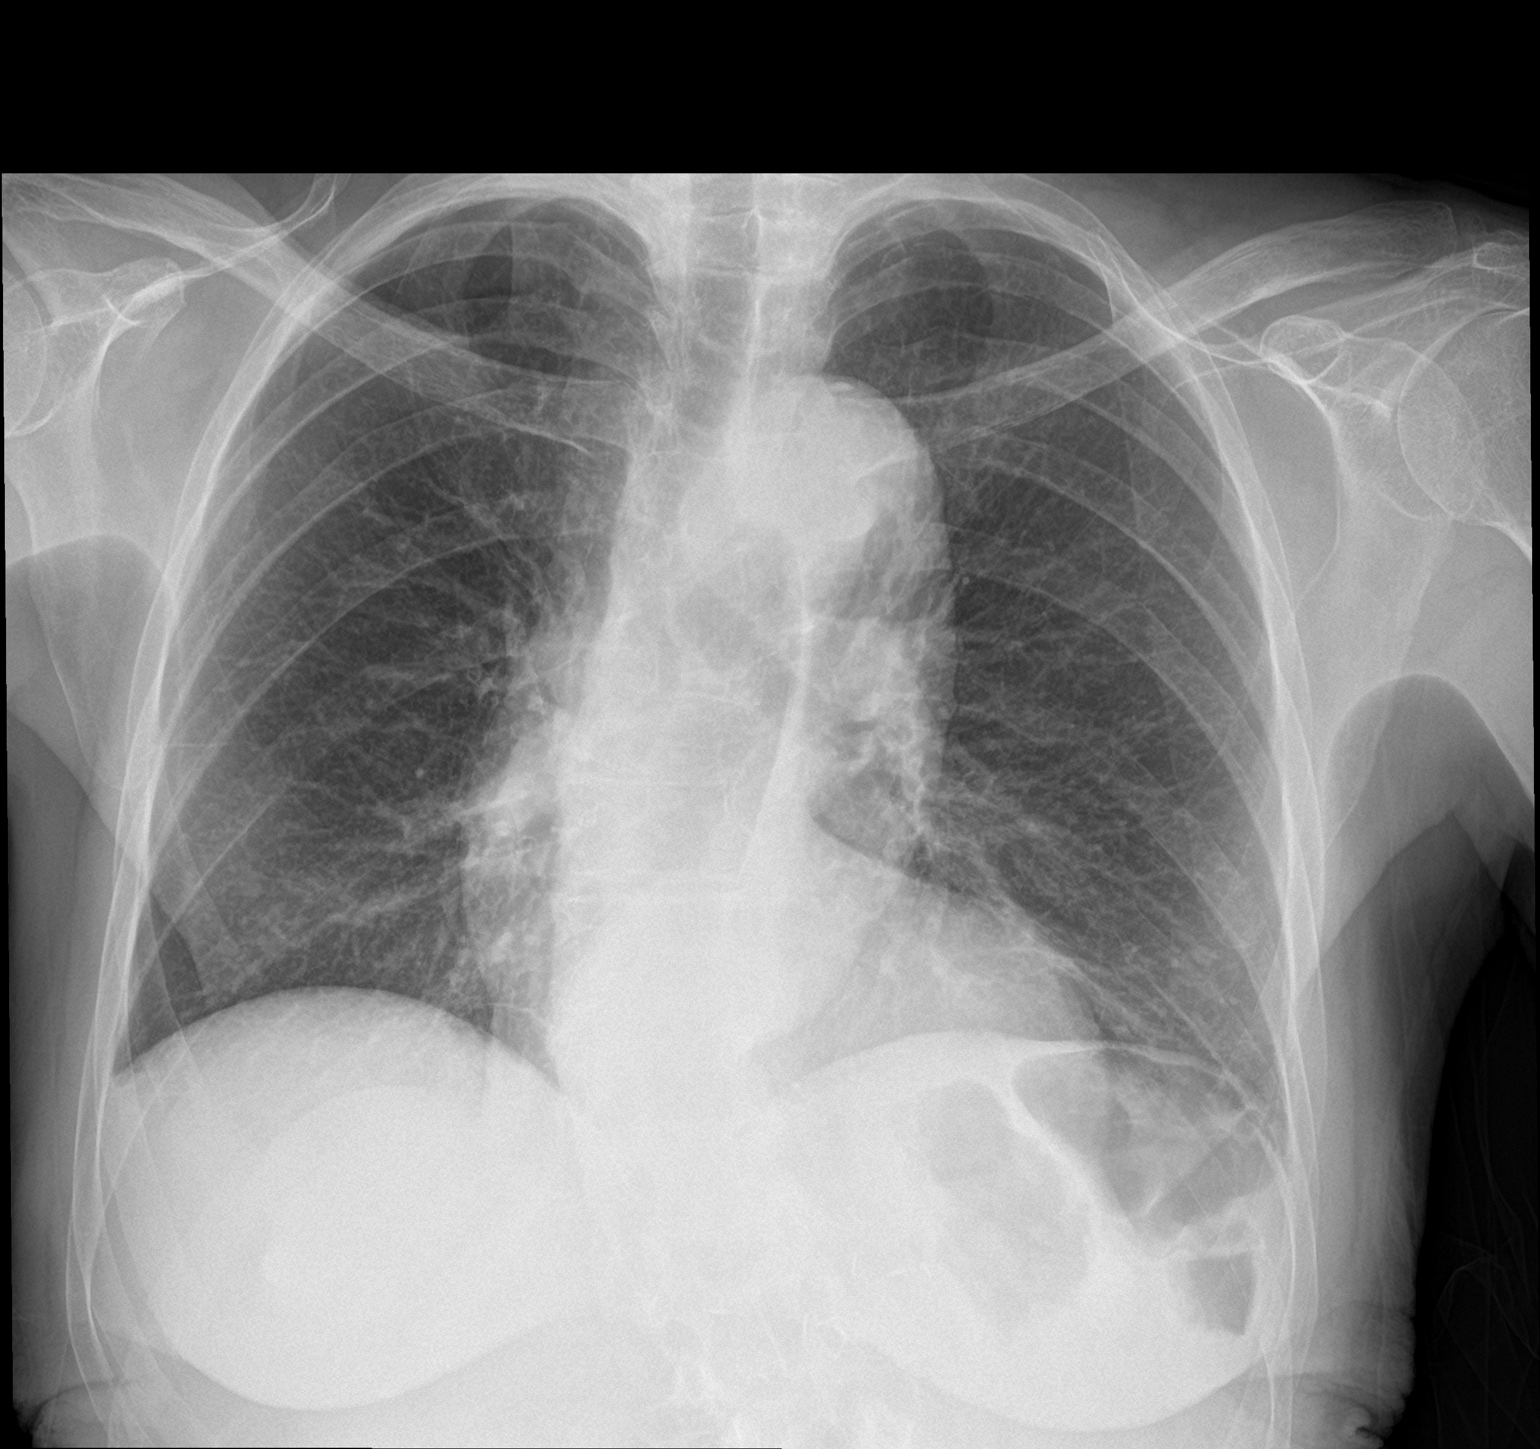

[2 of 2 positions shown; findings below may reference images not displayed]

FINDINGS: A prominent thoracic aorta is stable since February 16, 2014. The
heart, hila, and are unchanged. No pneumothorax. No nodules or
masses. No focal infiltrates.
IMPRESSION: The prominent thoracic aorta is stable since February 2014 based on
chest x-ray. No acute abnormalities are seen.
